# Patient Record
Sex: Female | Born: 1940 | Race: Black or African American | Hispanic: No | Smoking: Former smoker
Health system: Southern US, Community
[De-identification: ages and names within clinical notes are randomized; demographics above are authoritative.]

## PROBLEM LIST (undated history)

## (undated) DIAGNOSIS — I10 Essential (primary) hypertension: Secondary | ICD-10-CM

## (undated) DIAGNOSIS — I509 Heart failure, unspecified: Secondary | ICD-10-CM

## (undated) DIAGNOSIS — C50919 Malignant neoplasm of unspecified site of unspecified female breast: Secondary | ICD-10-CM

## (undated) DIAGNOSIS — E119 Type 2 diabetes mellitus without complications: Secondary | ICD-10-CM

## (undated) DIAGNOSIS — Z923 Personal history of irradiation: Secondary | ICD-10-CM

## (undated) DIAGNOSIS — Z9221 Personal history of antineoplastic chemotherapy: Secondary | ICD-10-CM

## (undated) DIAGNOSIS — J45909 Unspecified asthma, uncomplicated: Secondary | ICD-10-CM

## (undated) DIAGNOSIS — E7800 Pure hypercholesterolemia, unspecified: Secondary | ICD-10-CM

## (undated) HISTORY — PX: DILATION AND CURETTAGE OF UTERUS: SHX78

---

## 2001-03-15 DIAGNOSIS — Z923 Personal history of irradiation: Secondary | ICD-10-CM

## 2001-03-15 DIAGNOSIS — Z9221 Personal history of antineoplastic chemotherapy: Secondary | ICD-10-CM

## 2001-03-15 HISTORY — PX: BREAST LUMPECTOMY: SHX2

## 2001-03-15 HISTORY — PX: PORT-A-CATH REMOVAL: SHX5289

## 2001-03-15 HISTORY — DX: Personal history of irradiation: Z92.3

## 2001-03-15 HISTORY — PX: BREAST BIOPSY: SHX20

## 2001-03-15 HISTORY — DX: Personal history of antineoplastic chemotherapy: Z92.21

## 2003-12-16 ENCOUNTER — Ambulatory Visit: Payer: Self-pay | Admitting: Internal Medicine

## 2004-02-17 ENCOUNTER — Ambulatory Visit: Payer: Self-pay | Admitting: Oncology

## 2004-03-18 ENCOUNTER — Ambulatory Visit: Payer: Self-pay | Admitting: Oncology

## 2005-01-12 ENCOUNTER — Ambulatory Visit: Payer: Self-pay | Admitting: Oncology

## 2005-01-13 ENCOUNTER — Ambulatory Visit: Payer: Self-pay | Admitting: Oncology

## 2005-02-12 ENCOUNTER — Ambulatory Visit: Payer: Self-pay | Admitting: Oncology

## 2005-03-26 ENCOUNTER — Ambulatory Visit: Payer: Self-pay | Admitting: Oncology

## 2005-08-12 ENCOUNTER — Ambulatory Visit: Payer: Self-pay | Admitting: Oncology

## 2005-08-13 ENCOUNTER — Ambulatory Visit: Payer: Self-pay | Admitting: Oncology

## 2005-08-31 ENCOUNTER — Encounter: Payer: Self-pay | Admitting: Oncology

## 2005-09-12 ENCOUNTER — Encounter: Payer: Self-pay | Admitting: Oncology

## 2005-12-22 ENCOUNTER — Ambulatory Visit: Payer: Self-pay | Admitting: Oncology

## 2006-01-13 ENCOUNTER — Ambulatory Visit: Payer: Self-pay | Admitting: Oncology

## 2006-03-31 ENCOUNTER — Ambulatory Visit: Payer: Self-pay | Admitting: Oncology

## 2006-04-15 ENCOUNTER — Ambulatory Visit: Payer: Self-pay | Admitting: Oncology

## 2006-05-14 ENCOUNTER — Ambulatory Visit: Payer: Self-pay | Admitting: Oncology

## 2006-09-13 ENCOUNTER — Ambulatory Visit: Payer: Self-pay | Admitting: Oncology

## 2006-10-14 ENCOUNTER — Ambulatory Visit: Payer: Self-pay | Admitting: Oncology

## 2006-11-09 ENCOUNTER — Ambulatory Visit: Payer: Self-pay | Admitting: Oncology

## 2006-11-14 ENCOUNTER — Ambulatory Visit: Payer: Self-pay | Admitting: Oncology

## 2007-02-20 ENCOUNTER — Ambulatory Visit: Payer: Self-pay | Admitting: Oncology

## 2007-04-16 ENCOUNTER — Ambulatory Visit: Payer: Self-pay | Admitting: Oncology

## 2007-05-14 ENCOUNTER — Ambulatory Visit: Payer: Self-pay | Admitting: Oncology

## 2007-06-14 ENCOUNTER — Ambulatory Visit: Payer: Self-pay | Admitting: Oncology

## 2008-08-13 ENCOUNTER — Ambulatory Visit: Payer: Self-pay | Admitting: Oncology

## 2008-09-04 ENCOUNTER — Ambulatory Visit: Payer: Self-pay | Admitting: Internal Medicine

## 2008-09-04 ENCOUNTER — Ambulatory Visit: Payer: Self-pay | Admitting: Oncology

## 2008-09-12 ENCOUNTER — Ambulatory Visit: Payer: Self-pay | Admitting: Oncology

## 2009-04-15 ENCOUNTER — Ambulatory Visit: Payer: Self-pay | Admitting: Oncology

## 2009-05-07 ENCOUNTER — Ambulatory Visit: Payer: Self-pay | Admitting: Oncology

## 2009-05-13 ENCOUNTER — Ambulatory Visit: Payer: Self-pay | Admitting: Oncology

## 2009-09-17 ENCOUNTER — Ambulatory Visit: Payer: Self-pay | Admitting: Oncology

## 2010-10-08 ENCOUNTER — Ambulatory Visit: Payer: Self-pay | Admitting: Internal Medicine

## 2012-04-24 ENCOUNTER — Ambulatory Visit: Payer: Self-pay | Admitting: Internal Medicine

## 2013-01-23 ENCOUNTER — Ambulatory Visit: Payer: Self-pay | Admitting: Oncology

## 2013-02-12 ENCOUNTER — Ambulatory Visit: Payer: Self-pay | Admitting: Oncology

## 2013-03-15 ENCOUNTER — Ambulatory Visit: Payer: Self-pay | Admitting: Oncology

## 2013-04-24 ENCOUNTER — Ambulatory Visit: Payer: Self-pay | Admitting: Oncology

## 2013-06-12 ENCOUNTER — Inpatient Hospital Stay: Payer: Self-pay | Admitting: Internal Medicine

## 2013-06-12 LAB — CBC
HCT: 41.8 % (ref 35.0–47.0)
HGB: 13.5 g/dL (ref 12.0–16.0)
MCH: 28.1 pg (ref 26.0–34.0)
MCHC: 32.4 g/dL (ref 32.0–36.0)
MCV: 87 fL (ref 80–100)
PLATELETS: 221 10*3/uL (ref 150–440)
RBC: 4.82 10*6/uL (ref 3.80–5.20)
RDW: 15 % — ABNORMAL HIGH (ref 11.5–14.5)
WBC: 7.9 10*3/uL (ref 3.6–11.0)

## 2013-06-12 LAB — COMPREHENSIVE METABOLIC PANEL
ANION GAP: 4 — AB (ref 7–16)
AST: 23 U/L (ref 15–37)
Albumin: 3.6 g/dL (ref 3.4–5.0)
Alkaline Phosphatase: 61 U/L
BILIRUBIN TOTAL: 0.4 mg/dL (ref 0.2–1.0)
BUN: 13 mg/dL (ref 7–18)
CALCIUM: 9.1 mg/dL (ref 8.5–10.1)
Chloride: 107 mmol/L (ref 98–107)
Co2: 27 mmol/L (ref 21–32)
Creatinine: 0.74 mg/dL (ref 0.60–1.30)
EGFR (African American): >60
EGFR (Non-African Amer.): >60
Glucose: 184 mg/dL — ABNORMAL HIGH (ref 65–99)
Osmolality: 281 (ref 275–301)
POTASSIUM: 4 mmol/L (ref 3.5–5.1)
SGPT (ALT): 28 U/L (ref 12–78)
Sodium: 138 mmol/L (ref 136–145)
TOTAL PROTEIN: 7.3 g/dL (ref 6.4–8.2)

## 2013-06-12 LAB — URINALYSIS, COMPLETE
Bacteria: NONE SEEN
Glucose,UR: NEGATIVE mg/dL (ref 0–75)
KETONE: NEGATIVE
NITRITE: NEGATIVE
Ph: 7 (ref 4.5–8.0)
Protein: NEGATIVE
SPECIFIC GRAVITY: 1.01 (ref 1.003–1.030)
Squamous Epithelial: 1
WBC UR: 7 /HPF (ref 0–5)

## 2013-06-12 LAB — ETHANOL: Ethanol: <3 mg/dL

## 2013-06-12 LAB — CK TOTAL AND CKMB (NOT AT ARMC)
CK, Total: 120 U/L
CK, Total: 108 U/L
CK-MB: 2 ng/mL (ref 0.5–3.6)
CK-MB: 1.8 ng/mL (ref 0.5–3.6)

## 2013-06-12 LAB — TROPONIN I: Troponin-I: <0.02 ng/mL

## 2013-06-13 LAB — SEDIMENTATION RATE: Erythrocyte Sed Rate: 9 mm/hr (ref 0–30)

## 2013-06-13 LAB — TROPONIN I: Troponin-I: <0.02 ng/mL

## 2013-09-13 ENCOUNTER — Ambulatory Visit: Payer: Self-pay | Admitting: Oncology

## 2014-02-14 ENCOUNTER — Ambulatory Visit: Payer: Self-pay | Admitting: Oncology

## 2014-02-15 LAB — CANCER ANTIGEN 27.29: CA 27.29: 23.9 U/mL (ref 0.0–38.6)

## 2014-03-15 ENCOUNTER — Ambulatory Visit: Payer: Self-pay | Admitting: Oncology

## 2014-06-28 ENCOUNTER — Other Ambulatory Visit: Payer: Self-pay | Admitting: Oncology

## 2014-06-28 DIAGNOSIS — Z853 Personal history of malignant neoplasm of breast: Secondary | ICD-10-CM
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
## 2014-07-06 NOTE — Discharge Summary (Signed)
PATIENT NAMEDonetta, [name] MR#:  [id number] DATE OF BIRTH:  1940-03-19  DATE OF ADMISSION:  06/12/2013 DATE OF DISCHARGE:  06/14/2013   FINAL DIAGNOSES:  1. Left temporal cerebrovascular accident, acute.  2. Chronic left-sided systolic congestive heart failure.  3. Diabetes mellitus.  4. History of breast cancer.  5. Hypertension.   HISTORY AND PHYSICAL: Please see dictated admission history and physical.   HOSPITAL COURSE: The patient was admitted after episode of syncope. Evaluation revealed evidence of left temporal acute CVA. She was seen by neurology and by cardiology. Telemetry was followed, without significant dysrhythmias. Carotids were negative. She had been on aspirin 81 mg daily, and this was increased to 325 mg, and statin therapy was added, which she tolerated without issue. Her sugars remained reasonably controlled.    She underwent echocardiogram, which revealed an LVEF of 25 to 35%, a new finding, but without major symptoms. Again, no dysrhythmias were seen to link this to her episode of syncope. It is recommended that she follow up with cardiology to further investigate. She was started on Coreg during this hospitalization, which she tolerated.   At this time, she will be discharged home in stable condition. Physical therapy worked with her and did not feel she needed any further services. She should weigh herself daily, and call in for more than 2 pound gain in 1 day or 5 pounds in 1 week or increasing signs or symptoms of heart failure, which were discussed with the patient. She should check her blood sugar daily and record this. She will follow up in our office within the next 1 to 2 weeks and with neurology at first available and with Dr. Nehemiah Massed in the next 2 to 4 weeks.   DISCHARGE MEDICATIONS:  1. Lisinopril 10 mg p.o. daily.  2. Meloxicam 15 mg p.o. daily as needed for pain.  3. Multivitamin 1 p.o. daily. 4. Glimepiride 2 mg p.o. b.i.d.  5. Aspirin 325 mg p.o. daily.   6. Atorvastatin 40 mg p.o. daily.  7. Carvedilol 3.125 mg p.o. b.i.d.   ____________________________ Adin Hector, MD bjk:lb D: 06/14/2013 08:18:40 ET T: 06/14/2013 08:41:27 ET JOB#: [id number]  cc: Adin Hector, MD, <Dictator> Ramonita Lab MD ELECTRONICALLY SIGNED 06/18/2013 12:42

## 2014-07-06 NOTE — Consult Note (Signed)
PATIENT NAME:  [name], [name] MR#:  [id number] DATE OF BIRTH:  12-11-1940  DATE OF CONSULTATION:  06/13/2013  REFERRING PHYSICIAN:  Dr. Benjie Karvonen CONSULTING PHYSICIAN:  Corey Skains, MD  REASON FOR CONSULTATION: Acute syncopal episode with stroke, hypertension, hyperlipidemia, diabetes and abnormal echocardiogram.   CHIEF COMPLAINT: "I had passed out."  HISTORY OF PRESENT ILLNESS:  This is a 74 year old female who has done fairly well with diabetes, hypertension and hyperlipidemia treatments who has had recent headaches and concerns of headaches for which she has had syncopal episode with seizure. Further investigation after this issue, the patient has appeared to have had an MRI consistent with a stroke. The patient does feel better with no further headache and is on appropriate medication management.  Further investigation of her issues have seen an EKG showing sinus tachycardia with left axis deviation, poor precordial progression with pre-atrial contractions. In addition to that, the patient had an echocardiogram showing moderate global LV systolic dysfunction with ejection fraction of 35% and some mild to moderate valvular heart disease. She has had cancer chemotherapy suggestive of this causing her LV systolic dysfunction, but currently has no evidence of overt heart failure-type symptoms or true angina.  The remainder review of systems negative for vision change, ringing in the ears, hearing loss, cough, congestion, heartburn, nausea, vomiting, diarrhea, bloody stools, diaphoresis, frequent urination, urination at night, muscle weakness, numbness, anxiety, depression, skin lesions, skin rashes.   PAST MEDICAL HISTORY: 1.  Diabetes.  2.  Hypertension.  3.  Stroke.   FAMILY HISTORY: Multiple family members with early onset of cardiovascular disease and hypertension.   SOCIAL HISTORY: Currently denies alcohol or tobacco abuse.   ALLERGIES: As listed.   MEDICATIONS: As listed.   PHYSICAL  EXAMINATION: VITAL SIGNS: Blood pressure is 126/68, bilaterally, heart rate 72 upright, reclining and slightly irregular.  GENERAL: She is a well appearing female in no acute distress.  HEAD, EYES, EARS, NOSE AND THROAT:  No icterus, thyromegaly, ulcers, hemorrhage or xanthelasma.  CARDIOVASCULAR: Regular rate and rhythm. Normal S1 and S2 without murmur, gallop or rub. PMI is normal size and placement. Carotid upstroke normal without bruit. Jugular venous pressure is normal.  LUNGS: Clear to auscultation with normal respirations.  ABDOMEN: Soft, nontender, without hepatosplenomegaly or masses. Abdominal aorta is normal size without bruit.  EXTREMITIES: Shows 2+ bilateral pulses in dorsal, pedal, radial and femoral arteries without lower extremity edema, cyanosis, clubbing or ulcers.  NEUROLOGIC: She is oriented to time, place and person. Normal mood and affect.   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   RECOMMENDATIONS: 1.  Continue treatment of stroke with appropriate medication management. 2.  Consider statin therapy for further risk reduction in future cardiovascular disease.  3.  Further consideration of use of beta blocker and ACE inhibitor for cardiomyopathy for risk reduction in development of congestive heart failure.  4.  Diabetes medication management for hemoglobin A1c below 7.  5.  Ambulation and follow up for any further significant symptoms requiring further intervention and treatment options, including the possibility of future outpatient stress test if necessary for risk stratification of cardiomyopathy.  ____________________________ Corey Skains, MD bjk:ce D: 06/13/2013 16:45:54 ET T: 06/13/2013 17:50:50 ET JOB#: [id number]  cc: Corey Skains, MD,  <Dictator> Corey Skains MD ELECTRONICALLY SIGNED 06/18/2013 13:01

## 2015-02-19 ENCOUNTER — Inpatient Hospital Stay: Payer: Medicare Other | Attending: Oncology

## 2015-02-19 ENCOUNTER — Inpatient Hospital Stay: Payer: Medicare Other | Admitting: Oncology

## 2015-02-19 ENCOUNTER — Inpatient Hospital Stay: Payer: Medicare Other | Admitting: Family Medicine

## 2015-03-16 ENCOUNTER — Observation Stay
Admission: EM | Admit: 2015-03-16 | Discharge: 2015-03-18 | Disposition: A | Discharge status: Home / Self Care | Payer: Medicare Other | Attending: Internal Medicine | Admitting: Internal Medicine

## 2015-03-16 ENCOUNTER — Emergency Department: Payer: Medicare Other

## 2015-03-16 DIAGNOSIS — R112 Nausea with vomiting, unspecified: Secondary | ICD-10-CM | POA: Insufficient documentation

## 2015-03-16 DIAGNOSIS — R42 Dizziness and giddiness: Principal | ICD-10-CM | POA: Insufficient documentation

## 2015-03-16 DIAGNOSIS — E119 Type 2 diabetes mellitus without complications: Secondary | ICD-10-CM | POA: Insufficient documentation

## 2015-03-16 DIAGNOSIS — Z8249 Family history of ischemic heart disease and other diseases of the circulatory system: Secondary | ICD-10-CM | POA: Insufficient documentation

## 2015-03-16 DIAGNOSIS — Z79899 Other long term (current) drug therapy: Secondary | ICD-10-CM | POA: Diagnosis not present

## 2015-03-16 DIAGNOSIS — Z853 Personal history of malignant neoplasm of breast: Secondary | ICD-10-CM | POA: Diagnosis not present

## 2015-03-16 DIAGNOSIS — Z7951 Long term (current) use of inhaled steroids: Secondary | ICD-10-CM | POA: Diagnosis not present

## 2015-03-16 DIAGNOSIS — Z7982 Long term (current) use of aspirin: Secondary | ICD-10-CM | POA: Insufficient documentation

## 2015-03-16 DIAGNOSIS — Z791 Long term (current) use of non-steroidal anti-inflammatories (NSAID): Secondary | ICD-10-CM | POA: Insufficient documentation

## 2015-03-16 DIAGNOSIS — I10 Essential (primary) hypertension: Secondary | ICD-10-CM | POA: Diagnosis not present

## 2015-03-16 DIAGNOSIS — Z888 Allergy status to other drugs, medicaments and biological substances status: Secondary | ICD-10-CM | POA: Diagnosis not present

## 2015-03-16 HISTORY — DX: Malignant neoplasm of unspecified site of unspecified female breast: C50.919

## 2015-03-16 HISTORY — DX: Type 2 diabetes mellitus without complications: E11.9

## 2015-03-16 HISTORY — DX: Essential (primary) hypertension: I10

## 2015-03-16 LAB — BASIC METABOLIC PANEL
ANION GAP: 7 (ref 5–15)
BUN: 12 mg/dL (ref 6–20)
CHLORIDE: 104 mmol/L (ref 101–111)
CO2: 27 mmol/L (ref 22–32)
Calcium: 9.5 mg/dL (ref 8.9–10.3)
Creatinine, Ser: 0.65 mg/dL (ref 0.44–1.00)
GFR calc non Af Amer: >60 mL/min (ref >60)
Glucose, Bld: 264 mg/dL — ABNORMAL HIGH (ref 65–99)
Potassium: 4.2 mmol/L (ref 3.5–5.1)
Sodium: 138 mmol/L (ref 135–145)

## 2015-03-16 LAB — URINALYSIS COMPLETE WITH MICROSCOPIC (ARMC ONLY)
BILIRUBIN URINE: NEGATIVE
Bacteria, UA: NONE SEEN
GLUCOSE, UA: 50 mg/dL — AB
HGB URINE DIPSTICK: NEGATIVE
LEUKOCYTES UA: NEGATIVE
Nitrite: NEGATIVE
PH: 6 (ref 5.0–8.0)
Protein, ur: NEGATIVE mg/dL
Specific Gravity, Urine: 1.01 (ref 1.005–1.030)

## 2015-03-16 LAB — TROPONIN I: Troponin I: <0.03 ng/mL (ref <0.031)

## 2015-03-16 LAB — CBC
HCT: 38.6 % (ref 35.0–47.0)
Hemoglobin: 12.6 g/dL (ref 12.0–16.0)
MCH: 27.3 pg (ref 26.0–34.0)
MCHC: 32.6 g/dL (ref 32.0–36.0)
MCV: 83.9 fL (ref 80.0–100.0)
PLATELETS: 218 10*3/uL (ref 150–440)
RBC: 4.6 MIL/uL (ref 3.80–5.20)
RDW: 13.9 % (ref 11.5–14.5)
WBC: 8.4 10*3/uL (ref 3.6–11.0)

## 2015-03-16 LAB — GLUCOSE, CAPILLARY: Glucose-Capillary: 152 mg/dL — ABNORMAL HIGH (ref 65–99)

## 2015-03-16 MED ORDER — ALBUTEROL SULFATE (2.5 MG/3ML) 0.083% IN NEBU: 2.5000 mg | INHALATION_SOLUTION | Freq: Four times a day (QID) | RESPIRATORY_TRACT | Status: DC | PRN

## 2015-03-16 MED ORDER — MECLIZINE HCL 25 MG PO TABS
25.0000 mg | ORAL_TABLET | Freq: Once | ORAL | Status: AC
  Administered 2015-03-16: 25 mg via ORAL

## 2015-03-16 MED ORDER — SODIUM CHLORIDE 0.9 % IJ SOLN
3.0000 mL | Freq: Two times a day (BID) | INTRAMUSCULAR | Status: DC
  Administered 2015-03-16 – 2015-03-18 (×4): 3 mL via INTRAVENOUS

## 2015-03-16 MED ORDER — PROMETHAZINE HCL 25 MG/ML IJ SOLN
12.5000 mg | Freq: Once | INTRAMUSCULAR | Status: AC
  Administered 2015-03-16: 12.5 mg via INTRAVENOUS
  Filled 2015-03-16: qty 1

## 2015-03-16 MED ORDER — MECLIZINE HCL 25 MG PO TABS
25.0000 mg | ORAL_TABLET | Freq: Three times a day (TID) | ORAL | Status: DC
  Administered 2015-03-16 – 2015-03-18 (×5): 25 mg via ORAL
  Filled 2015-03-16 (×5): qty 1

## 2015-03-16 MED ORDER — PROMETHAZINE HCL 25 MG/ML IJ SOLN
6.2500 mg | Freq: Once | INTRAMUSCULAR | Status: AC
  Administered 2015-03-16: 6.25 mg via INTRAVENOUS

## 2015-03-16 MED ORDER — FLUTICASONE PROPIONATE 50 MCG/ACT NA SUSP
2.0000 | Freq: Every day | NASAL | Status: DC
  Administered 2015-03-17 – 2015-03-18 (×2): 2 via NASAL
  Filled 2015-03-16: qty 16

## 2015-03-16 MED ORDER — SODIUM CHLORIDE 0.9 % IV SOLN
250.0000 mL | INTRAVENOUS | Status: DC | PRN
Start: 2015-03-16 — End: 2015-03-18

## 2015-03-16 MED ORDER — CARVEDILOL 3.125 MG PO TABS
3.1250 mg | ORAL_TABLET | Freq: Two times a day (BID) | ORAL | Status: DC
  Administered 2015-03-16 – 2015-03-18 (×4): 3.125 mg via ORAL
  Filled 2015-03-16 (×4): qty 1

## 2015-03-16 MED ORDER — INSULIN ASPART 100 UNIT/ML ~~LOC~~ SOLN
0.0000 [IU] | Freq: Three times a day (TID) | SUBCUTANEOUS | Status: DC
  Administered 2015-03-17: 3 [IU] via SUBCUTANEOUS
  Administered 2015-03-17 – 2015-03-18 (×2): 1 [IU] via SUBCUTANEOUS
  Filled 2015-03-16: qty 3
  Filled 2015-03-16 (×3): qty 1

## 2015-03-16 MED ORDER — ACETAMINOPHEN 650 MG RE SUPP: 650.0000 mg | Freq: Four times a day (QID) | RECTAL | Status: DC | PRN

## 2015-03-16 MED ORDER — MECLIZINE HCL 25 MG PO TABS
ORAL_TABLET | ORAL | Status: AC
  Administered 2015-03-16: 25 mg via ORAL
  Filled 2015-03-16: qty 1

## 2015-03-16 MED ORDER — ONDANSETRON HCL 4 MG PO TABS: 4.0000 mg | ORAL_TABLET | Freq: Four times a day (QID) | ORAL | Status: DC | PRN

## 2015-03-16 MED ORDER — MECLIZINE HCL 25 MG PO TABS: 25.0000 mg | ORAL_TABLET | Freq: Three times a day (TID) | ORAL | Status: DC

## 2015-03-16 MED ORDER — ALBUTEROL SULFATE HFA 108 (90 BASE) MCG/ACT IN AERS: 2.0000 | INHALATION_SPRAY | Freq: Four times a day (QID) | RESPIRATORY_TRACT | Status: DC | PRN

## 2015-03-16 MED ORDER — LISINOPRIL 20 MG PO TABS
40.0000 mg | ORAL_TABLET | Freq: Every day | ORAL | Status: DC
  Administered 2015-03-17 – 2015-03-18 (×2): 40 mg via ORAL
  Filled 2015-03-16 (×2): qty 2

## 2015-03-16 MED ORDER — SODIUM CHLORIDE 0.9 % IJ SOLN: 3.0000 mL | INTRAMUSCULAR | Status: DC | PRN

## 2015-03-16 MED ORDER — MELOXICAM 7.5 MG PO TABS: 15.0000 mg | ORAL_TABLET | Freq: Every day | ORAL | Status: DC | PRN

## 2015-03-16 MED ORDER — ACETAMINOPHEN 325 MG PO TABS: 650.0000 mg | ORAL_TABLET | Freq: Four times a day (QID) | ORAL | Status: DC | PRN

## 2015-03-16 MED ORDER — LORAZEPAM 2 MG/ML IJ SOLN
1.0000 mg | Freq: Once | INTRAMUSCULAR | Status: AC
  Administered 2015-03-16: 1 mg via INTRAVENOUS
  Filled 2015-03-16: qty 1

## 2015-03-16 MED ORDER — ADULT MULTIVITAMIN W/MINERALS CH
1.0000 | ORAL_TABLET | Freq: Every day | ORAL | Status: DC
Start: 2015-03-16 — End: 2015-03-18
  Administered 2015-03-17 – 2015-03-18 (×2): 1 via ORAL
  Filled 2015-03-16 (×3): qty 1

## 2015-03-16 MED ORDER — SPIRONOLACTONE 25 MG PO TABS
25.0000 mg | ORAL_TABLET | ORAL | Status: DC
  Administered 2015-03-17 – 2015-03-18 (×2): 25 mg via ORAL
  Filled 2015-03-16 (×2): qty 1

## 2015-03-16 MED ORDER — PROMETHAZINE HCL 25 MG/ML IJ SOLN
INTRAMUSCULAR | Status: AC
  Administered 2015-03-16: 6.25 mg via INTRAVENOUS
  Filled 2015-03-16: qty 1

## 2015-03-16 MED ORDER — MECLIZINE HCL 25 MG PO TABS
25.0000 mg | ORAL_TABLET | Freq: Once | ORAL | Status: AC
  Administered 2015-03-16: 25 mg via ORAL
  Filled 2015-03-16: qty 1

## 2015-03-16 MED ORDER — ENOXAPARIN SODIUM 40 MG/0.4ML ~~LOC~~ SOLN
40.0000 mg | SUBCUTANEOUS | Status: DC
  Administered 2015-03-16 – 2015-03-17 (×2): 40 mg via SUBCUTANEOUS
  Filled 2015-03-16 (×2): qty 0.4

## 2015-03-16 MED ORDER — ASPIRIN EC 81 MG PO TBEC
81.0000 mg | DELAYED_RELEASE_TABLET | Freq: Every day | ORAL | Status: DC
  Administered 2015-03-17 – 2015-03-18 (×2): 81 mg via ORAL
  Filled 2015-03-16 (×2): qty 1

## 2015-03-16 MED ORDER — ONDANSETRON HCL 4 MG/2ML IJ SOLN
4.0000 mg | Freq: Four times a day (QID) | INTRAMUSCULAR | Status: DC | PRN
  Administered 2015-03-16: 4 mg via INTRAVENOUS
  Filled 2015-03-16: qty 2

## 2015-03-16 NOTE — ED Notes (Signed)
Phenergan completed and line flushed. Pt assisted to toilet with two person assist. Pt states that nausea has decreased but states still feels like room is spinning. Pt assisted back to bed and placed back on monitor.

## 2015-03-16 NOTE — ED Notes (Signed)
Pt back from CT and is complaining of continued nausea and vomiting. Medication administered per MD orders at this time. Will continue to monitor pt.

## 2015-03-16 NOTE — ED Notes (Signed)
Pt covered with warm blankets  Family at bedside

## 2015-03-16 NOTE — ED Notes (Signed)
Pt arrives to ED via [facility] c/o dizziness and nausea/vomiting that began at [place] today after sudden onset of epigastric pain that has now ceased. Pt received 4mg  Zofran IM en route with EMS. Pt denies pain at this time. Pt states she feels that the room is spinning. Spitting up thick white production. Diaphoretic.

## 2015-03-16 NOTE — ED Notes (Signed)
Patient transported to CT 

## 2015-03-16 NOTE — Plan of Care (Signed)
Problem: Nutrition: Goal: Adequate nutrition will be maintained Outcome: Progressing Patient is alert and oriented. No complaints of nausea. VSS. Medication given for nausea with good affect. Remains dizzy.

## 2015-03-16 NOTE — H&P (Signed)
[facility] [place] NAME: [name]    MR#:  [id number]  DATE OF BIRTH:  1940/08/31  DATE OF ADMISSION:  03/16/2015  PRIMARY CARE PHYSICIAN: Tama High III, MD   REQUESTING/REFERRING PHYSICIAN: dr Samella Parr   my room was spinning today HISTORY OF PRESENT ILLNESS:  [name]  is a 75 y.o. female with a known history of type 2 diabetes, hypertension, history of breast cancer comes to the emergency room after patient started having symptoms of dizziness and "" room spinning this morning. Patient had an episode of vomiting felt nauseous. She was difficult to walk around without having fever on falling. Denies any falls. Denies any focal weakness dysarthria. Denies history of stroke in the past. Patient received a couple doses of meclizine and an overdose of Ativan. Chest fields readily better how warm moment I got her to sit up she started feeling symptoms of dizziness. She is being admitted for what ago.    PAST MEDICAL HISTORY:   Past Medical History  Diagnosis Date  . Diabetes mellitus without complication ([place])   . Hypertension   . Breast cancer ([place])     PAST SURGICAL HISTOIRY:  History reviewed. No pertinent past surgical history.  SOCIAL HISTORY:   Social History  Substance Use Topics  . Smoking status: Never Smoker   . Smokeless tobacco: Not on file  . Alcohol Use: No    FAMILY HISTORY:  No family history on file.  DRUG ALLERGIES:   Allergies  Allergen Reactions  . Codeine Other (See Comments)    hallucinations  . Lovastatin     Other reaction(s): Unknown  . Metformin     Other reaction(s): Unknown    REVIEW OF SYSTEMS:  Review of Systems  Constitutional: Positive for diaphoresis. Negative for fever and chills.  HENT: Negative for congestion, ear pain, hearing loss, nosebleeds and sore throat.   Eyes: Negative for blurred vision, double vision, photophobia and pain.  Respiratory: Negative for  hemoptysis, sputum production, wheezing and stridor.   Cardiovascular: Negative for orthopnea, claudication and leg swelling.  Gastrointestinal: Negative for heartburn and abdominal pain.  Genitourinary: Negative for dysuria and frequency.  Musculoskeletal: Negative for back pain, joint pain and neck pain.  Skin: Negative for rash.  Neurological: Positive for dizziness and weakness. Negative for tingling, sensory change, speech change, focal weakness, seizures and headaches.  Endo/Heme/Allergies: Does not bruise/bleed easily.  Psychiatric/Behavioral: Negative for suicidal ideas, memory loss and substance abuse. The patient is not nervous/anxious.   All other systems reviewed and are negative.    MEDICATIONS AT HOME:   Prior to Admission medications   Medication Sig Start Date End Date Taking? Authorizing Provider  albuterol (PROAIR HFA) 108 (90 Base) MCG/ACT inhaler Inhale 2 puffs into the lungs 4 (four) times daily as needed. For wheezing. 08/05/14  Yes Historical Provider, MD  aspirin EC 81 MG tablet Take 81 mg by mouth daily.   Yes Historical Provider, MD  carvedilol (COREG) 3.125 MG tablet Take 3.125 mg by mouth 2 (two) times daily. 11/28/14  Yes Historical Provider, MD  fluticasone (FLONASE) 50 MCG/ACT nasal spray Place 2 sprays into the nose daily. 10/25/14  Yes Historical Provider, MD  lisinopril (PRINIVIL,ZESTRIL) 40 MG tablet Take 40 mg by mouth daily. 02/24/15  Yes Historical Provider, MD  meloxicam (MOBIC) 15 MG tablet Take 15 mg by mouth daily as needed. For pain. 06/24/14  Yes Historical Provider, MD  Multiple Vitamin (MULTI-VITAMINS) TABS  Take 1 tablet by mouth daily.   Yes Historical Provider, MD  spironolactone (ALDACTONE) 25 MG tablet Take 25 mg by mouth every morning. 01/24/15  Yes Historical Provider, MD      VITAL SIGNS:  Blood pressure 105/65, pulse 94, temperature 95.8 F (35.4 C), temperature source Rectal, resp. rate 31, height 5\' 5"  (1.651 m), weight 79.379 kg (175  lb), SpO2 96 %.  PHYSICAL EXAMINATION:  GENERAL:  75 y.o.-year-old patient lying in the bed with no acute distress.  EYES: Pupils equal, round, reactive to light and accommodation. No scleral icterus. Extraocular muscles intact.  HEENT: Head atraumatic, normocephalic. Oropharynx and nasopharynx clear.  NECK:  Supple, no jugular venous distention. No thyroid enlargement, no tenderness.  LUNGS: Normal breath sounds bilaterally, no wheezing, rales,rhonchi or crepitation. No use of accessory muscles of respiration.  CARDIOVASCULAR: S1, S2 normal. No murmurs, rubs, or gallops.  ABDOMEN: Soft, nontender, nondistended. Bowel sounds present. No organomegaly or mass.  EXTREMITIES: No pedal edema, cyanosis, or clubbing.  NEUROLOGIC: Cranial nerves II through XII are intact. Muscle strength 5/5 in all extremities. Sensation intact. Gait not checked.  PSYCHIATRIC: The patient is alert and oriented x 3.  SKIN: No obvious rash, lesion, or ulcer.   LABORATORY PANEL:   CBC  Recent Labs Lab 03/16/15 1034  WBC 8.4  HGB 12.6  HCT 38.6  PLT 218   ------------------------------------------------------------------------------------------------------------------  Chemistries   Recent Labs Lab 03/16/15 1034  NA 138  K 4.2  CL 104  CO2 27  GLUCOSE 264*  BUN 12  CREATININE 0.65  CALCIUM 9.5   ------------------------------------------------------------------------------------------------------------------  Cardiac Enzymes  Recent Labs Lab 03/16/15 1034  TROPONINI <0.03   ------------------------------------------------------------------------------------------------------------------  RADIOLOGY:  Ct Head Wo Contrast  03/16/2015  CLINICAL DATA:  75 year old female with acute vertigo and nausea/ vomiting. EXAM: CT HEAD WITHOUT CONTRAST TECHNIQUE: Contiguous axial images were obtained from the base of the skull through the vertex without intravenous contrast. COMPARISON:  06/12/2013 CT.  FINDINGS: No intracranial abnormalities are identified, including mass lesion or mass effect, hydrocephalus, extra-axial fluid collection, midline shift, hemorrhage, or acute infarction. The visualized bony calvarium is unremarkable. The visualized mastoid air cells, middle/ inner ears and paranasal sinuses are clear. IMPRESSION: Unremarkable noncontrast head CT. Electronically Signed   By: Margarette Canada M.D.   On: 03/16/2015 12:02    EKG:   Sinus rhythm IMPRESSION AND PLAN:  ris Lantagne  is a 76 y.o. female with a known history of type 2 diabetes, hypertension, history of breast cancer comes to the emergency room after patient started having symptoms of dizziness and "" room spinning this morning. Patient had an episode of vomiting felt nauseous.   1. acute vertigo, appears benign positional given patient's presentation. -She has symptoms of sinus congestion a few days ago. -No history of stroke or any of the symptoms in the past. -Continue meclizine 25 mg 3 times a day, physical therapy. -CT of the head is negative. Consider MRI of the brain if symptoms are not improved.  -Consider outpatient ENT follow-up as well    2. TYPE 2 DIABETES CONTINUE HOME MEDS AND SLIDING SCALE INSULIN.   3. Hypertension  -Continue lisinopril carvedilol and spironolactone  4. DVT prophylaxis subcutaneous Lovenox   All the records are reviewed and case discussed with ED provider. Management plans discussed with the patient, family and they are in agreement.  CODE STATUS: full   TOTAL TIME TAKING CARE OF THIS PATIENT:45nutes.    Shomari Matusik M.D on 03/16/2015 at 3:22  PM  Between 7am to 6pm - Pager - [phone]  After 6pm go to [email or web address] - password [email or web address] [facility]  [facility]  Office  [phone]  CC: Primary care physician; Adin Hector, MD

## 2015-03-16 NOTE — ED Provider Notes (Signed)
Time Seen: Approximately *1034 I have reviewed the triage notes  Chief Complaint: Dizziness and Nausea   History of Present Illness: [name] is a 75 y.o. female who presents with acute onset of feeling that the room is spinning. Patient states she knows that this morning approximately 8 AM. She states she had been up and ambulatory prior to that for a short period of time. She states it feels like the room is spinning without any feelings of lightheadedness. She denies any tinnitus or facial weakness. She denies any focal weakness in either upper or lower extremities. She denies any facial pain patient's somewhat poor historian and slow to respond at times to questioning.   Past Medical History  Diagnosis Date  . Diabetes mellitus without complication ([place])   . Hypertension   . Breast cancer [facility])     Patient Active Problem List   Diagnosis Date Noted  . Vertigo 03/16/2015    History reviewed. No pertinent past surgical history.  History reviewed. No pertinent past surgical history.  Current Outpatient Rx  Name  Route  Sig  Dispense  Refill  . albuterol (PROAIR HFA) 108 (90 Base) MCG/ACT inhaler   Inhalation   Inhale 2 puffs into the lungs 4 (four) times daily as needed. For wheezing.         Marland Kitchen aspirin EC 81 MG tablet   Oral   Take 81 mg by mouth daily.         . carvedilol (COREG) 3.125 MG tablet   Oral   Take 3.125 mg by mouth 2 (two) times daily.         . fluticasone (FLONASE) 50 MCG/ACT nasal spray   Nasal   Place 2 sprays into the nose daily.         Marland Kitchen lisinopril (PRINIVIL,ZESTRIL) 40 MG tablet   Oral   Take 40 mg by mouth daily.         . meloxicam (MOBIC) 15 MG tablet   Oral   Take 15 mg by mouth daily as needed. For pain.         . Multiple Vitamin (MULTI-VITAMINS) TABS   Oral   Take 1 tablet by mouth daily.         Marland Kitchen spironolactone (ALDACTONE) 25 MG tablet   Oral   Take 25 mg by mouth every morning.           Allergies:   Codeine; Lovastatin; and Metformin  Family History: No family history on file.  Social History: Social History  Substance Use Topics  . Smoking status: Never Smoker   . Smokeless tobacco: None  . Alcohol Use: No     Review of Systems:   10 point review of systems was performed and was otherwise negative:  Constitutional: No fever Eyes: No visual disturbances ENT: No sore throat, ear pain Cardiac: No chest pain Respiratory: No shortness of breath, wheezing, or stridor Abdomen: No abdominal pain, no vomiting, No diarrhea Endocrine: No weight loss, No night sweats Extremities: No peripheral edema, cyanosis Skin: No rashes, easy bruising Neurologic: No focal weakness, trouble with speech or swollowing Urologic: No dysuria, Hematuria, or urinary frequency   Physical Exam:  ED Triage Vitals  Enc Vitals Group     BP 03/16/15 1025 140/83 mmHg     Pulse Rate 03/16/15 1025 89     Resp 03/16/15 1025 12     Temp 03/16/15 1040 95.8 F (35.4 C)     Temp Source 03/16/15 1055  Rectal     SpO2 03/16/15 1025 98 %     Weight 03/16/15 1025 175 lb (79.379 kg)     Height 03/16/15 1025 5\' 5"  (1.651 m)     Head Cir --      Peak Flow --      Pain Score --      Pain Loc --      Pain Edu? --      Excl. in [place]? --     General: Awake , Alert , and Oriented times 3; GCS 15 Head: Normal cephalic , atraumatic. Conjunctiva are clear. Patient has trouble keeping her eyes open for exam though appears to have some 4-5 beat left lateral nystagmus which is exhaustive Eyes: Pupils equal , round, reactive to light Nose/Throat: No nasal drainage, patent upper airway without erythema or exudate.  Neck: Supple, Full range of motion, No anterior adenopathy or palpable thyroid masses no bruits Lungs: Clear to ascultation without wheezes , rhonchi, or rales Heart: Regular rate, regular rhythm without murmurs , gallops , or rubs Abdomen: Soft, non tender without rebound, guarding , or rigidity; bowel  sounds positive and symmetric in all 4 quadrants. No organomegaly .        Extremities: 2 plus symmetric pulses. No edema, clubbing or cyanosis Neurologic: , Motor symmetric without deficits, sensory intact. No cerebellar signs Skin: warm, dry, no rashes   Labs:   All laboratory work was reviewed including any pertinent negatives or positives listed below:  Labs Reviewed  BASIC METABOLIC PANEL - Abnormal; Notable for the following:    Glucose, Bld 264 (*)    All other components within normal limits  URINALYSIS COMPLETEWITH MICROSCOPIC ([facility] ONLY) - Abnormal; Notable for the following:    Color, Urine YELLOW (*)    APPearance CLEAR (*)    Glucose, UA 50 (*)    Ketones, ur TRACE (*)    Squamous Epithelial / LPF 0-5 (*)    All other components within normal limits  CBC  TROPONIN I  CBG MONITORING, ED    EKG: ED ECG REPORT I, Daymon Larsen, the attending physician, personally viewed and interpreted this ECG.  Date: 03/16/2015 EKG Time: 1021 Rate: 86 Rhythm: normal sinus rhythm occasional PACs QRS Axis is left axis deviation Intervals: normal ST/T Wave abnormalities: normal Conduction Disutrbances: none Narrative Interpretation: unremarkable No acute ischemic changes  vomiting.  EXAM: CT HEAD WITHOUT CONTRAST  TECHNIQUE: Contiguous axial images were obtained from the base of the skull through the vertex without intravenous contrast.  COMPARISON: 06/12/2013 CT.  FINDINGS: No intracranial abnormalities are identified, including mass lesion or mass effect, hydrocephalus, extra-axial fluid collection, midline shift, hemorrhage, or acute infarction.  The visualized bony calvarium is unremarkable.  The visualized mastoid air cells, middle/ inner ears and paranasal sinuses are clear.  IMPRESSION: Unremarkable noncontrast head CT.  Radiology:      I personally reviewed the radiologic studies   P ED Course:  Patient's stay here showed little  improvement. She had some persistent nausea and vomiting associated with her vertigo. She was initially given Zofran with really no effect. She was given low-dose Phenergan at 6.25 again with no effect and was given a repeat dosage of Phenergan. Her nausea and vomiting seems to improved and she was given oral meclizine 25 mg 2. She still has a hard time moving her head nor attempting ambulation at this time and I felt uncomfortable discharging the patient. I did briefly attempt Epley maneuvers with the patient  have any significant reproduction of her symptoms but no resolution. I felt she most likely had peripheral exhaustive vertigo at this time and is unlikely to be central vertigo. Patient does not exhibit any other focal neurologic deficits.    Assessment:  Persistent peripheral vertigo   Final Clinical Impression: *  Final diagnoses:  Vertigo     Plan: Inpatient management            Daymon Larsen, MD 03/16/15 [phone]

## 2015-03-16 NOTE — ED Notes (Signed)
Pt denies nausea and pain at this time. Pt complaining of room still spinning. Pt's HOB raised. Family returned to room and MD aware. Waiting on results.

## 2015-03-16 NOTE — ED Notes (Signed)
MD at bedside. 

## 2015-03-17 ENCOUNTER — Observation Stay: Payer: Medicare Other

## 2015-03-17 LAB — GLUCOSE, CAPILLARY
GLUCOSE-CAPILLARY: 142 mg/dL — AB (ref 65–99)
GLUCOSE-CAPILLARY: 204 mg/dL — AB (ref 65–99)
GLUCOSE-CAPILLARY: 120 mg/dL — AB (ref 65–99)
Glucose-Capillary: 121 mg/dL — ABNORMAL HIGH (ref 65–99)

## 2015-03-17 MED ORDER — MECLIZINE HCL 25 MG PO TABS: 25.0000 mg | ORAL_TABLET | Freq: Three times a day (TID) | ORAL | Status: AC

## 2015-03-17 NOTE — Plan of Care (Signed)
Problem: Pain Managment: Goal: General experience of comfort will improve Outcome: Progressing No c/o pain  Problem: Physical Regulation: Goal: Ability to maintain clinical measurements within normal limits will improve Outcome: Progressing Pt noted to have decreasing vertigo throughout shift. Understood the need to take Antivert to help maintain the vertigo.  Problem: Activity: Goal: Risk for activity intolerance will decrease Outcome: Progressing Pt continues to need stand by assist when ambulating due to occasional wobbling

## 2015-03-17 NOTE — Care Management Obs Status (Signed)
[place] NOTIFICATION   Patient Details  Name: Leyton Strain MRN: [id number] Date of Birth: 09-03-1940   Medicare Observation Status Notification Given:  Yes    Shelbie Ammons, RN 03/17/2015, 8:55 AM

## 2015-03-17 NOTE — Plan of Care (Signed)
Problem: Safety: Goal: Ability to remain free from injury will improve Outcome: Progressing Pt states she is having decrease in her dizziness.  Does need stand by assist to ambulate secondary to wobbling  Occasionally.

## 2015-03-17 NOTE — Discharge Summary (Signed)
[name], is a 75 y.o. female  DOB [birth year]  MRN [id number].  Admission date:  03/16/2015  Admitting Physician  Fritzi Mandes, MD  Discharge Date:  03/17/2015   Primary MD  Will[place] III, MD  Recommendations for primary care physician for things to follow:   F/u Dr.Bert KLEIN in one week.  She is  requesting stress test for cardiac evaluation which can be done as an outpatient. She does not have any chest pain, shortness of breath. Admission Diagnosis  Vertigo [R42]   Discharge Diagnosis  Vertigo [R42]    Active Problems:   Vertigo      Past Medical History  Diagnosis Date  . Diabetes mellitus without complication ([place])   . Hypertension   . Breast cancer ([place])     History reviewed. No pertinent past surgical history.     History of present illness and  Hospital Course:     Kindly see H&P for history of present illness and admission details, please review complete Labs, Consult reports and Test reports for all details in brief  HPI  from the history and physical done on the day of admission 75 year old female patient with history of diabetes mellitus type 2, hypertension admitted secondary to dizziness, positional vertigo. Patient felt sweaty, nauseous and dizzy suddenly started yesterday.   Hospital Course   #1 dizziness likely due to positional vertigo: Symptoms nicely improved with meclizine. However still has some occasional dizziness. Concern about possibility of posterior circulation strokes on going to get MRI of the brain. If the MRI of the brain is negative patient can be discharged home physical therapy, saw the patient and recommended outpatient physical therapy for vestibular exercises. #2 diabetes mellitus type 2 without complications: Continue home medications. #Hypertension; controlled  continue home medications. #4 history of coronary artery disease in the family multiple members of the family had heart disease. Patient concerned about heart attack. This time she has no chest pain, shortness of breath. I advised her to follow with primary doctor and possibly scheduling stress test as an outpatient.    Discharge Condition: stable,   Follow UP      Discharge Instructions  and  Discharge Medications        Medication List    TAKE these medications        aspirin EC 81 MG tablet  Take 81 mg by mouth daily.     carvedilol 3.125 MG tablet  Commonly known as:  COREG  Take 3.125 mg by mouth 2 (two) times daily.     fluticasone 50 MCG/ACT nasal spray  Commonly known as:  FLONASE  Place 2 sprays into the nose daily.     lisinopril 40 MG tablet  Commonly known as:  PRINIVIL,ZESTRIL  Take 40 mg by mouth daily.     meclizine 25 MG tablet  Commonly known as:  ANTIVERT  Take 1 tablet (25 mg total) by mouth 3 (three) times daily.     meloxicam 15 MG tablet  Commonly known as:  MOBIC  Take 15 mg by mouth daily as needed. For pain.     MULTI-VITAMINS Tabs  Take 1 tablet by mouth daily.     PROAIR HFA 108 (90 Base) MCG/ACT inhaler  Generic drug:  albuterol  Inhale 2 puffs into the lungs 4 (four) times daily as needed. For wheezing.     spironolactone 25 MG tablet  Commonly known as:  ALDACTONE  Take 25 mg by mouth every morning.  Diet and Activity recommendation: See Discharge Instructions above   Consults obtained - PT   Major procedures and Radiology Reports - PLEASE review detailed and final reports for all details, in brief -     Ct Head Wo Contrast  03/16/2015  CLINICAL DATA:  75 year old female with acute vertigo and nausea/ vomiting. EXAM: CT HEAD WITHOUT CONTRAST TECHNIQUE: Contiguous axial images were obtained from the base of the skull through the vertex without intravenous contrast. COMPARISON:  06/12/2013 CT. FINDINGS: No  intracranial abnormalities are identified, including mass lesion or mass effect, hydrocephalus, extra-axial fluid collection, midline shift, hemorrhage, or acute infarction. The visualized bony calvarium is unremarkable. The visualized mastoid air cells, middle/ inner ears and paranasal sinuses are clear. IMPRESSION: Unremarkable noncontrast head CT. Electronically Signed   By: Margarette Canada M.D.   On: 03/16/2015 12:02    Micro Results     No results found for this or any previous visit (from the past 240 hour(s)).     Today   Subjective:   [name] today has no headache,no chest abdominal pain,no new weakness tingling or numbness, feels much better wants to go home today.   Objective:   Blood pressure 136/60, pulse 91, temperature 98.3 F (36.8 C), temperature source Oral, resp. rate 18, height 5\' 4"  (1.626 m), weight 81.92 kg (180 lb 9.6 oz), SpO2 95 %.   Intake/Output Summary (Last 24 hours) at 03/17/15 1158 Last data filed at 03/17/15 0800  Gross per 24 hour  Intake    240 ml  Output      0 ml  Net    240 ml    Exam Awake Alert, Oriented x 3, No new F.N deficits, Normal affect [place].AT,PERRAL Supple Neck,No JVD, No cervical lymphadenopathy appriciated.  Symmetrical Chest wall movement, Good air movement bilaterally, CTAB RRR,No Gallops,Rubs or new Murmurs, No Parasternal Heave +ve B.Sounds, Abd Soft, Non tender, No organomegaly appriciated, No rebound -guarding or rigidity. No Cyanosis, Clubbing or edema, No new Rash or bruise  Data Review   CBC w Diff: Lab Results  Component Value Date   WBC 8.4 03/16/2015   WBC 7.9 06/12/2013   HGB 12.6 03/16/2015   HGB 13.5 06/12/2013   HCT 38.6 03/16/2015   HCT 41.8 06/12/2013   PLT 218 03/16/2015   PLT 221 06/12/2013    CMP: Lab Results  Component Value Date   NA 138 03/16/2015   NA 138 06/12/2013   K 4.2 03/16/2015   K 4.0 06/12/2013   CL 104 03/16/2015   CL 107 06/12/2013   CO2 27 03/16/2015   CO2 27 06/12/2013    BUN 12 03/16/2015   BUN 13 06/12/2013   CREATININE 0.65 03/16/2015   CREATININE 0.74 06/12/2013   PROT 7.3 06/12/2013   ALBUMIN 3.6 06/12/2013   BILITOT 0.4 06/12/2013   ALKPHOS 61 06/12/2013   AST 23 06/12/2013   ALT 28 06/12/2013  .   Total Time in preparing paper work, data evaluation and todays exam - 68 minutes  Shaquile Lutze M.D on 03/17/2015 at 11:58 AM    Note: This dictation was prepared with Dragon dictation along with smaller phrase technology. Any transcriptional errors that result from this process are unintentional.

## 2015-03-17 NOTE — Evaluation (Signed)
Physical Therapy Evaluation Patient Details Name: [name] MRN: [id number] DOB: [birth year] Today's Date: 03/17/2015   History of Present Illness  Pt is a 75 y.o. female presenting to hospital with dizziness and room spinning; also episode of vomiting d/t feeling nauseas.  Head CT negative.  Pt admitted with acute vertigo.  PMH includes DM type 2, htn, breast CA.  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  Pt lives with her 2 grandchildren in 1 level home with 2 steps to enter no railing.  Currently pt is modified independent supine to sit (HOB elevated); CGA to stand no AD (min assist to control descent sitting on 2nd trial standing after gait attempt d/t increasing dizziness with time standing and then pt reporting feeling really nauseas and had emesis); and CGA to max assist for balance with short ambulation distance in room no AD (pt with increasing dizziness with time standing and with fall towards bed requiring mod to max assist to prevent fall).  Pt initially appearing steady with first attempt standing and first few steps but dizziness significantly got worse next few steps.  No nystagmus noted during functional mobility but some noted looking with eyes superior and to L in general.  Pt would benefit from skilled PT to address noted impairments and functional limitations.  Limited session d/t pt feeling very nauseas and with emesis (nursing notified).  Pt currently very limited with mobility and requiring significant assist for balance d/t dizziness.  Anticipate once vertigo improves/resolves, pt will be able to discharge back home.     Follow Up Recommendations Outpatient PT (vestibular pending further work-up)    Equipment Recommendations   (TBD)    Recommendations for Other Services       Precautions / Restrictions Precautions Precautions: Fall Restrictions Weight Bearing Restrictions: No      Mobility  Bed  Mobility Overal bed mobility: Modified Independent             General bed mobility comments: Supine to sit with HOB elevated  Transfers Overall transfer level: Needs assistance Equipment used: None Transfers: Sit to/from Stand Sit to Stand: Min guard;Min assist         General transfer comment: Initial stand CGA but with time standing pt becoming dizzy and requiring assist to control descent back into sitting  Ambulation/Gait Ambulation/Gait assistance: Min guard;Min assist;Mod assist;Max assist (varying levels d/t dizziness) Ambulation Distance (Feet): 15 Feet Assistive device: None   Gait velocity: decreased   General Gait Details: pt steady first few steps but then started to hold onto bed d/t feeling dizzy; pt then c/o significant dizziness requiring min assist to steady and pt requiring mod to max assist to prevent fall onto bed (then with min to mod assist able to get pt safely to recliner chair)  Stairs            Wheelchair Mobility    Modified Rankin (Stroke Patients Only)       Balance Overall balance assessment: Needs assistance Sitting-balance support: No upper extremity supported;Feet supported Sitting balance-Leahy Scale: Normal     Standing balance support: No upper extremity supported Standing balance-Leahy Scale: Poor Standing balance comment: d/t dizziness pt with multiple loss of balance                             Pertinent Vitals/Pain Pain Assessment: No/denies pain  Vitals stable and WFL throughout  treatment session.    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  Pt agreeable to PT session.    Exercises        Assessment/Plan    PT Assessment Patient needs continued PT services  PT Diagnosis Difficulty walking   PT Problem List Decreased balance;Decreased mobility  PT Treatment Interventions DME instruction;Gait training;Stair training;Functional mobility training;Therapeutic activities;Therapeutic exercise;Balance training;Patient/family education   PT Goals (Current goals can be found in the Care Plan section) Acute Rehab PT Goals Patient Stated Goal: to go home PT Goal Formulation: With patient Time For Goal Achievement: 03/31/15 Potential to Achieve Goals: Good    Frequency Min 2X/week   Barriers to discharge   Dizziness    Co-evaluation               End of Session Equipment Utilized During Treatment: Gait belt Activity Tolerance:  (Pt limited d/t dizziness and nauseas/emesis) Patient left: in chair;with call bell/phone within reach;with chair alarm set Nurse Communication: Mobility status;Precautions (Emesis)    Functional Assessment Tool Used: AM-PAC without stairs Functional Limitation: Mobility: Walking and moving around Mobility: Walking and Moving Around Current Status [phone]): At least 40 percent but less than 60 percent impaired, limited or restricted Mobility: Walking and Moving Around Goal Status [phone]): 0 percent impaired, limited or  restricted    Time: [id number] PT Time Calculation (min) (ACUTE ONLY): 28 min   Charges:   PT Evaluation $Initial PT Evaluation Tier I: 1 Procedure     PT G Codes:   PT G-Codes **NOT FOR INPATIENT CLASS** Functional Assessment Tool Used: AM-PAC without stairs Functional Limitation: Mobility: Walking and moving around Mobility: Walking and Moving Around Current Status [id number]): At least 40 percent but less than 60 percent impaired, limited or restricted Mobility: Walking and Moving Around Goal Status [phone]): 0 percent impaired, limited or restricted    Leitha Bleak 03/17/2015, 10:45 AM Leitha Bleak, PT [phone]

## 2015-03-17 NOTE — Plan of Care (Signed)
Problem: Safety: Goal: Ability to remain free from injury will improve Outcome: Progressing No injury this shift.   

## 2015-03-17 NOTE — Care Management (Signed)
Admitted to [facility] with the diagnosis of Vertigo under observation status. Lives with grandson/granddaughter. Sister is Letta Median [phone]). Brother is Coralyn Mark [phone]). No home health. No skilled facility. No home oxygen. Uses no aids for ambulation. 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 Takes care of both basic and instrumental activities of daily living herself, drives. No falls. Good appetite until today. Family will transport. Shelbie Ammons RN MSN CCM Care Management [phone]

## 2015-03-18 LAB — GLUCOSE, CAPILLARY
GLUCOSE-CAPILLARY: 152 mg/dL — AB (ref 65–99)
Glucose-Capillary: 128 mg/dL — ABNORMAL HIGH (ref 65–99)

## 2015-03-18 NOTE — Progress Notes (Signed)
     [name] was admitted to the Hospital on 03/16/2015 and Discharged  03/18/2015 and should be excused from work/school   for 6  days starting 03/16/2015 , may return to work/school without any restrictions.stay off work till Jan 6th.    Epifanio Lesches M.D on 03/18/2015,at 10:03 AM

## 2015-03-18 NOTE — Progress Notes (Signed)
[facility] NAME: Milania Hamric    MR#:  [id number]  DATE OF BIRTH:  15-Mar-1941  SUBJECTIVE: Needed for dizziness, positional vertigo. Started on meclizine. She has some dizziness. So MRI of the brain is ordered. Discharge is held because MRI of the brain is not done yet.   CHIEF COMPLAINT:   Chief Complaint  Patient presents with  . Dizziness  . Nausea    REVIEW OF SYSTEMS:   ROS CONSTITUTIONAL: No fever, fatigue or weakness.  EYES: No blurred or double vision.  EARS, NOSE, AND THROAT: No tinnitus or ear pain.  RESPIRATORY: No cough, shortness of breath, wheezing or hemoptysis.  CARDIOVASCULAR: No chest pain, orthopnea, edema.  GASTROINTESTINAL: No nausea, vomiting, diarrhea or abdominal pain.  GENITOURINARY: No dysuria, hematuria.  ENDOCRINE: No polyuria, nocturia,  HEMATOLOGY: No anemia, easy bruising or bleeding SKIN: No rash or lesion. MUSCULOSKELETAL: No joint pain or arthritis.   NEUROLOGIC: No tingling, numbness, weakness.  PSYCHIATRY: No anxiety or depression.   DRUG ALLERGIES:   Allergies  Allergen Reactions  . Codeine Other (See Comments)    hallucinations  . Lovastatin     Other reaction(s): Unknown  . Metformin     Other reaction(s): Unknown    VITALS:  Blood pressure 109/50, pulse 80, temperature 98 F (36.7 C), temperature source Oral, resp. rate 20, height 5\' 4"  (1.626 m), weight 81.92 kg (180 lb 9.6 oz), SpO2 94 %.  PHYSICAL EXAMINATION:  GENERAL:  75 y.o.-year-old patient lying in the bed with no acute distress.  EYES: Pupils equal, round, reactive to light and accommodation. No scleral icterus. Extraocular muscles intact.  HEENT: Head atraumatic, normocephalic. Oropharynx and nasopharynx clear.  NECK:  Supple, no jugular venous distention. No thyroid enlargement, no tenderness.  LUNGS: Normal breath sounds bilaterally, no wheezing, rales,rhonchi or crepitation. No use of accessory muscles  of respiration.  CARDIOVASCULAR: S1, S2 normal. No murmurs, rubs, or gallops.  ABDOMEN: Soft, nontender, nondistended. Bowel sounds present. No organomegaly or mass.  EXTREMITIES: No pedal edema, cyanosis, or clubbing.  NEUROLOGIC: Cranial nerves II through XII are intact. Muscle strength 5/5 in all extremities. Sensation intact. Gait not checked.  PSYCHIATRIC: The patient is alert and oriented x 3.  SKIN: No obvious rash, lesion, or ulcer.    LABORATORY PANEL:   CBC  Recent Labs Lab 03/16/15 1034  WBC 8.4  HGB 12.6  HCT 38.6  PLT 218   ------------------------------------------------------------------------------------------------------------------  Chemistries   Recent Labs Lab 03/16/15 1034  NA 138  K 4.2  CL 104  CO2 27  GLUCOSE 264*  BUN 12  CREATININE 0.65  CALCIUM 9.5   ------------------------------------------------------------------------------------------------------------------  Cardiac Enzymes  Recent Labs Lab 03/16/15 1034  TROPONINI <0.03   ------------------------------------------------------------------------------------------------------------------  RADIOLOGY:  Mr Brain Wo Contrast  03/17/2015  CLINICAL DATA:  75 year old hypertensive diabetic female with lung cancer. Positional vertigo. Partial response to the medication. Subsequent encounter. EXAM: MRI HEAD WITHOUT CONTRAST TECHNIQUE: Multiplanar, multiecho pulse sequences of the brain and surrounding structures were obtained without intravenous contrast. COMPARISON:  03/16/2015 CT.  06/13/2013 MR. FINDINGS: No acute infarct. Minimal chronic small vessel disease type changes. No intracranial hemorrhage. No intracranial mass lesion noted on this unenhanced exam. No hydrocephalus. Major intracranial vascular structures are patent. Cervical medullary junction, pituitary region, pineal region and orbital structures unremarkable. Minimal ethmoid sinus mucosal thickening. Mild maxillary sinus mucosal  thickening. IMPRESSION: No acute infarct. Minimal ethmoid sinus mucosal thickening. Mild maxillary sinus mucosal thickening.  Electronically Signed   By: Genia Del M.D.   On: 03/17/2015 17:36    EKG:   Orders placed or performed during the hospital encounter of 03/16/15  . ED EKG  . ED EKG  . EKG 12-Lead  . EKG 12-Lead    ASSESSMENT AND PLAN:  1 dizziness likely due to BPV: Patient symptoms improved slightly with meclizine. Scheduled for MRI of the brain if it's negative for posterior circulation stroke patient will be discharged home on meclizine.     All the records are reviewed and case discussed with Care Management/Social Workerr. Management plans discussed with the patient, family and they are in agreement.  CODE STATUS:full  TOTAL TIME TAKING CARE OF THIS PATIENT: 35 minutes.   POSSIBLE D/C IN 1DAYS, DEPENDING ON CLINICAL CONDITION.   Epifanio Lesches M.D on 03/18/2015 at 12:11 PM  Between 7am to 6pm - Pager - [phone]  After 6pm go to [email or web address] - password [email or web address] [facility]  Office  [phone]  CC: Primary care physician; Adin Hector, MD   Note: This dictation was prepared with Dragon dictation along with smaller phrase technology. Any transcriptional errors that result from this process are unintentional.

## 2015-03-18 NOTE — Plan of Care (Signed)
Problem: Safety: Goal: Ability to remain free from injury will improve Outcome: Progressing On high fall precautions per policy. Ambulates with assist.  Problem: Pain Managment: Goal: General experience of comfort will improve Outcome: Progressing No complaints of pain.

## 2015-03-18 NOTE — Progress Notes (Signed)
FSBS 128 pet NT Judeen Hammans, reports she had to do patient override

## 2015-03-18 NOTE — Discharge Summary (Signed)
[name], is a 75 y.o. female  DOB [name] 28, 1942  MRN [id number].  Admission date:  03/16/2015  Admitting Physician  Fritzi Mandes, MD  Discharge Date:  03/18/2015   Primary MD  [place] III, MD  Recommendations for primary care physician for things to follow:   F/u Dr.Bert KLEIN in one week.  She is  requesting stress test for cardiac evaluation which can be done as an outpatient. She does not have any chest pain, shortness of breath. Admission Diagnosis  Vertigo [R42]   Discharge Diagnosis  Vertigo [R42]    Active Problems:   Vertigo      Past Medical History  Diagnosis Date  . Diabetes mellitus without complication ([place])   . Hypertension   . Breast cancer ([place])     History reviewed. No pertinent past surgical history.     History of present illness and  Hospital Course:     Kindly see H&P for history of present illness and admission details, please review complete Labs, Consult reports and Test reports for all details in brief  HPI  from the history and physical done on the day of admission 75 year old female patient with history of diabetes mellitus type 2, hypertension admitted secondary to dizziness, positional vertigo. Patient felt sweaty, nauseous and dizzy suddenly started yesterday.   Hospital Course   #1 dizziness likely due to positional vertigo: Symptoms nicely improved with meclizine. However still has some occasional dizziness. Concern about possibility of posterior circulation strokes on going to get MRI of the brain. If the MRI of the brain is negative patient can be discharged home physical therapy, saw the patient and recommended outpatient physical therapy for vestibular exercises. #2 diabetes mellitus type 2 without complications: Continue home medications. #Hypertension; controlled  continue home medications. #4 history of coronary artery disease in the family multiple members of the family had heart disease. Patient concerned about heart attack. This time she has no chest pain, shortness of breath. I advised her to follow with primary doctor and possibly scheduling stress test as an outpatient.  Addendum on  Jan 3rd;: Patient MRI of the brain is negative.  she feels much better today. Stable to go home today.  Discharge Condition: stable,   Follow UP      Discharge Instructions  and  Discharge Medications         Discharge Instructions    Ambulatory referral to Physical Therapy    Complete by:  As directed   Vestibular exercises  Iontophoresis - 4 mg/ml of dexamethasone:  No  T.E.N.S. Unit Evaluation and Dispense as Indicated:  No            Medication List    TAKE these medications        aspirin EC 81 MG tablet  Take 81 mg by mouth daily.     carvedilol 3.125 MG tablet  Commonly known as:  COREG  Take 3.125 mg by mouth 2 (two) times daily.     fluticasone 50 MCG/ACT nasal spray  Commonly known as:  FLONASE  Place 2 sprays into the nose daily.     lisinopril 40 MG tablet  Commonly known as:  PRINIVIL,ZESTRIL  Take 40 mg by mouth daily.     meclizine 25 MG tablet  Commonly known as:  ANTIVERT  Take 1 tablet (25 mg total) by mouth 3 (three) times daily.     meloxicam 15 MG tablet  Commonly known as:  MOBIC  Take 15 mg  by mouth daily as needed. For pain.     MULTI-VITAMINS Tabs  Take 1 tablet by mouth daily.     PROAIR HFA 108 (90 Base) MCG/ACT inhaler  Generic drug:  albuterol  Inhale 2 puffs into the lungs 4 (four) times daily as needed. For wheezing.     spironolactone 25 MG tablet  Commonly known as:  ALDACTONE  Take 25 mg by mouth every morning.          Diet and Activity recommendation: See Discharge Instructions above   Consults obtained - PT   Major procedures and Radiology Reports - PLEASE review detailed and  final reports for all details, in brief -     Ct Head Wo Contrast  03/16/2015  CLINICAL DATA:  75 year old female with acute vertigo and nausea/ vomiting. EXAM: CT HEAD WITHOUT CONTRAST TECHNIQUE: Contiguous axial images were obtained from the base of the skull through the vertex without intravenous contrast. COMPARISON:  06/12/2013 CT. FINDINGS: No intracranial abnormalities are identified, including mass lesion or mass effect, hydrocephalus, extra-axial fluid collection, midline shift, hemorrhage, or acute infarction. The visualized bony calvarium is unremarkable. The visualized mastoid air cells, middle/ inner ears and paranasal sinuses are clear. IMPRESSION: Unremarkable noncontrast head CT. Electronically Signed   By: Margarette Canada M.D.   On: 03/16/2015 12:02   Mr Brain Wo Contrast  03/17/2015  CLINICAL DATA:  75 year old hypertensive diabetic female with lung cancer. Positional vertigo. Partial response to the medication. Subsequent encounter. EXAM: MRI HEAD WITHOUT CONTRAST TECHNIQUE: Multiplanar, multiecho pulse sequences of the brain and surrounding structures were obtained without intravenous contrast. COMPARISON:  03/16/2015 CT.  06/13/2013 MR. FINDINGS: No acute infarct. Minimal chronic small vessel disease type changes. No intracranial hemorrhage. No intracranial mass lesion noted on this unenhanced exam. No hydrocephalus. Major intracranial vascular structures are patent. Cervical medullary junction, pituitary region, pineal region and orbital structures unremarkable. Minimal ethmoid sinus mucosal thickening. Mild maxillary sinus mucosal thickening. IMPRESSION: No acute infarct. Minimal ethmoid sinus mucosal thickening. Mild maxillary sinus mucosal thickening. Electronically Signed   By: Genia Del M.D.   On: 03/17/2015 17:36    Micro Results     No results found for this or any previous visit (from the past 240 hour(s)).     Today   Subjective:   [name] today has no  headache,no chest abdominal pain,no new weakness tingling or numbness, feels much better wants to go home today.   Objective:   Blood pressure 109/50, pulse 80, temperature 98 F (36.7 C), temperature source Oral, resp. rate 20, height 5\' 4"  (1.626 m), weight 81.92 kg (180 lb 9.6 oz), SpO2 94 %.   Intake/Output Summary (Last 24 hours) at 03/18/15 1210 Last data filed at 03/18/15 0732  Gross per 24 hour  Intake    480 ml  Output   2050 ml  Net  -1570 ml    Exam Awake Alert, Oriented x 3, No new F.N deficits, Normal affect [place].AT,PERRAL Supple Neck,No JVD, No cervical lymphadenopathy appriciated.  Symmetrical Chest wall movement, Good air movement bilaterally, CTAB RRR,No Gallops,Rubs or new Murmurs, No Parasternal Heave +ve B.Sounds, Abd Soft, Non tender, No organomegaly appriciated, No rebound -guarding or rigidity. No Cyanosis, Clubbing or edema, No new Rash or bruise  Data Review   CBC w Diff:  Lab Results  Component Value Date   WBC 8.4 03/16/2015   WBC 7.9 06/12/2013   HGB 12.6 03/16/2015   HGB 13.5 06/12/2013   HCT 38.6 03/16/2015  HCT 41.8 06/12/2013   PLT 218 03/16/2015   PLT 221 06/12/2013    CMP:  Lab Results  Component Value Date   NA 138 03/16/2015   NA 138 06/12/2013   K 4.2 03/16/2015   K 4.0 06/12/2013   CL 104 03/16/2015   CL 107 06/12/2013   CO2 27 03/16/2015   CO2 27 06/12/2013   BUN 12 03/16/2015   BUN 13 06/12/2013   CREATININE 0.65 03/16/2015   CREATININE 0.74 06/12/2013   PROT 7.3 06/12/2013   ALBUMIN 3.6 06/12/2013   BILITOT 0.4 06/12/2013   ALKPHOS 61 06/12/2013   AST 23 06/12/2013   ALT 28 06/12/2013  .   Total Time in preparing paper work, data evaluation and todays exam - 42 minutes  Kenyanna Grzesiak M.D on 03/18/2015 at 12:10 PM    Note: This dictation was prepared with Dragon dictation along with smaller phrase technology. Any transcriptional errors that result from this process are unintentional.

## 2015-03-18 NOTE — Progress Notes (Addendum)
Notified Dr. Vianne Bulls that d/c summary report to d/c with outpatient home health but pt is d/c to home. Per MD discharge order is correct and that pt's PCP will have to order outpatient physical therapy, RN explained process to patient and she understands.   Patient is concerned that she doesn't have all the answers on vertigo or the cause, reviewed handout with patient about vertigo, explained to patient that vertigo can be cased by variety of etiologies, reviewed possibilities and educated patient on meclizine and positional changes and that Dr. Caryl Comes will help follow up further as outpatient.

## 2015-03-18 NOTE — Progress Notes (Signed)
Pt is alert and oriented x 4, easily irritable, on room air, vital signs stable, up to bathroom with stand by assist, good appetite, FSBS elevated at lunch but pt prefers to hold off on insulin as she will be leaving before trays are distributed, pt is concerned that she does not completely understand the cause and plan for vertigo. Printed information provided to patient, RN reviewed printed information and reported to patient that plan is for out patient physical therapy that will have to be ordered by PCP, Dr. Caryl Comes. Pt instructed to f/u with PCP in 1 week and to continue taking meclizine as prescribed. Pt reports understanding of d/c instructions and has no further questions at this time. Pt reports that MD said she would order a congestion medication, Dr. Michel Santee aware of patients request via telephone, per MD she will send something into pharmacy. Pt d/c via family, uneventful shift.   Pt does not want bed alarm since she is being d/c to home, pt educated on hospital policy and irritable over bed alarm, pt notified that bed alarm can be removed at discretion of patient and that it will be documented.

## 2016-02-10 ENCOUNTER — Inpatient Hospital Stay: Admission: RE | Admit: 2016-02-10 | Payer: Medicare Other | Source: Ambulatory Visit

## 2016-02-13 NOTE — H&P (Signed)
[name] is a 75 y.o. female here for Fractional d+c and myosure resection of an endometrial polyp  Consult from [facility] , [place] for PMB . Started several weeks ago . Heavy . ++ cramping .  Pt has had Breast ca 2003 . Was on Femara and has been off meds for several yrs . No pelvic pain otherwise . No fhx of uterine cancer. A large cervical polyp was removed at her consultation  EMBX = neg  Cx Polyp+ benign  SIS 01/08/16 : a large 2.04 x1.7 x2.33 cm  endometrial mass  Past Medical History:  has a past medical history of Allergic state; Asthma without status asthmaticus; Breast cancer (CMS-HCC); Cardiomyopathy, secondary (CMS-HCC); CHF (congestive heart failure) (CMS-HCC); Diabetes mellitus type 2, uncomplicated (CMS-HCC) ([id number]); Hyperlipidemia; Hypertension; Leukopenia; and Syncope and collapse (06/2013).  Past Surgical History:  has a past surgical history that includes lymphectomy; Breast surgery (Right, 2003); and Tubal ligation. Family History: family history includes Coronary artery disease in her brother, brother, and father; Heart disease in her brother; Lung cancer in her brother; No Known Problems in her mother. Social History:  reports that she has quit smoking. She quit smokeless tobacco use about 30 years ago. She reports that she does not drink alcohol. OB/GYN History:          OB History    Gravida Para Term Preterm AB Living   3 2 2   1 2    SAB TAB Ectopic Molar Multiple Live Births                    Allergies: is allergic to codeine; lovastatin; and metformin. Medications:  Current Outpatient Prescriptions:  .  aspirin 325 MG EC tablet, Take 325 mg by mouth once daily., Disp: , Rfl:  .  azelastine (ASTELIN) 137 mcg nasal spray, Place 1 spray into both nostrils 2 (two) times daily., Disp: 30 mL, Rfl: 3 .  carvedilol (COREG) 3.125 MG tablet, TAKE 1 TABLET BY MOUTH TWICE A DAY, Disp: 180 tablet, Rfl: 1 .  clopidogrel (PLAVIX) 75 mg tablet, TAKE 1 TABLET DAILY, Disp:  90 tablet, Rfl: 2 .  fluticasone (FLONASE) 50 mcg/actuation nasal spray, USE 2 SPRAYS EACH NOSTRILS EVERY DAY, Disp: 16 g, Rfl: 1 .  glimepiride (AMARYL) 4 MG tablet, TAKE 1/2 TABLET DAILY, Disp: 45 tablet, Rfl: 2 .  lisinopril (PRINIVIL,ZESTRIL) 40 MG tablet, TAKE 1 TABLET BY MOUTH ONCE A DAY, Disp: 90 tablet, Rfl: 3 .  meclizine (ANTIVERT) 25 mg tablet, Take by mouth., Disp: , Rfl:  .  meloxicam (MOBIC) 15 MG tablet, TAKE 1 TABLET BY MOUTH ONCE A DAY AS NEEDED, Disp: 30 tablet, Rfl: 5 .  multivitamin tablet, Take 1 tablet by mouth once daily., Disp: , Rfl:  .  PROAIR HFA 90 mcg/actuation inhaler, INHALE 2 PUFFS BY MOUTH 4 TIMES A DAY AS NEEDED WHEEZE, Disp: 8.5 Inhaler, Rfl: 6 .  spironolactone (ALDACTONE) 25 MG tablet, Take 1 tablet (25 mg total) by mouth once daily., Disp: 90 tablet, Rfl: 2  Review of Systems: General:                      No fatigue or weight loss Eyes:                           No vision changes Ears:  No hearing difficulty Respiratory:                No cough or shortness of breath Pulmonary:                  No asthma or shortness of breath Cardiovascular:           No chest pain, palpitations, dyspnea on exertion Gastrointestinal:          No abdominal bloating, chronic diarrhea, constipations, masses, pain or hematochezia Genitourinary:             No hematuria, dysuria, abnormal vaginal discharge, pelvic pain, Menometrorrhagia, + PMB  Lymphatic:                   No swollen lymph nodes Musculoskeletal:         No muscle weakness Neurologic:                  No extremity weakness, syncope, seizure disorder Psychiatric:                  No history of depression, delusions or suicidal/homicidal ideation    Exam:      Vitals:   01/08/16 1339  BP: 132/84  Pulse: 92    Body mass index is 30.42 kg/m.  WDWN black female in NAD   Lungs: CTA  CV : RRR without murmur    Neck:  no thyromegaly Abdomen: soft , no mass, normal  active bowel sounds,  non-tender, no rebound tenderness Pelvic: tanner stage 5 ,  External genitalia: vulva /labia no lesions Urethra: no prolapse Vagina: normal physiologic d/c Cervix: no lesions, no cervical motion tenderness   Uterus: normal size shape and contour, non-tender Adnexa: no mass,  non-tender   Rectovaginal:  Impression:   The primary encounter diagnosis was PMB (postmenopausal bleeding). A diagnosis of Endometrial mass was also pertinent to this visit.    Plan:   Recommend Myosure resection of the endometrial mass and Fractional d+c Risk of the procedure discussed with the pt. All questions answered .     Caroline Sauger, MD

## 2016-02-16 ENCOUNTER — Inpatient Hospital Stay: Admission: RE | Admit: 2016-02-16 | Payer: Medicare Other | Source: Ambulatory Visit

## 2016-02-20 ENCOUNTER — Ambulatory Visit
Admission: RE | Admit: 2016-02-20 | Payer: Medicare Other | Source: Ambulatory Visit | Admitting: Obstetrics and Gynecology

## 2016-02-20 ENCOUNTER — Encounter: Admission: RE | Payer: Self-pay | Source: Ambulatory Visit

## 2016-02-20 SURGERY — DILATATION & CURETTAGE/HYSTEROSCOPY WITH MYOSURE
Anesthesia: Choice

## 2016-03-10 ENCOUNTER — Other Ambulatory Visit: Payer: Self-pay | Admitting: Internal Medicine

## 2016-03-10 DIAGNOSIS — Z1231 Encounter for screening mammogram for malignant neoplasm of breast: Secondary | ICD-10-CM

## 2016-04-12 ENCOUNTER — Ambulatory Visit
Admission: RE | Admit: 2016-04-12 | Discharge: 2016-04-12 | Disposition: A | Discharge status: Home / Self Care | Payer: Medicare Other | Source: Ambulatory Visit | Attending: Internal Medicine | Admitting: Internal Medicine

## 2016-04-12 DIAGNOSIS — Z1231 Encounter for screening mammogram for malignant neoplasm of breast: Secondary | ICD-10-CM | POA: Insufficient documentation

## 2016-07-14 NOTE — H&P (Signed)
Allison Nearing, MD  Obstetrics    [] Hide copied text Allison.Hesteris a 76 y.o.femalehere for Fractional d+c and myosure resection of an endometrial polyp  Consult from [employer] , [place] for PMB . Started several weeks ago . Heavy . ++ cramping .  Pt has had Breast ca 2003 . Was on Femara and has been off meds for several yrs . No pelvic pain otherwise . No fhx of uterine cancer. A large cervical polyp was removed at her consultation  EMBX = neg  Cx Polyp+ benign  SIS 01/08/16 : a large 2.04 x1.7 x2.33 cm endometrial mass  Past Medical History:has a past medical history of Allergic state; Asthma without status asthmaticus; Breast cancer (CMS-HCC); Cardiomyopathy, secondary (CMS-HCC); CHF (congestive heart failure) (CMS-HCC); Diabetes mellitus type 2, uncomplicated (CMS-HCC) (2703); Hyperlipidemia; Hypertension; Leukopenia; and Syncope and collapse (06/2013). Past Surgical History:has a past surgical history that includes lymphectomy; Breast surgery (Right, 2003); and Tubal ligation. Family History:family history includes Coronary artery disease in her brother, brother, and father; Heart disease in her brother; Lung cancer in her brother; No Known Problems in her mother. Social History:reports that she has quit smoking. She quit smokeless tobacco use about 30 years ago. She reports that she does not drink alcohol. OB/GYN History:         OB History   Gravida Para Term Preterm AB Living   3 2 2  1 2    SAB TAB Ectopic Molar Multiple Live Births              Allergies:is allergic to codeine; lovastatin; and metformin. Medications:  Current Outpatient Prescriptions:  .aspirin 325 MG EC tablet, Take 325 mg by mouth once daily., Disp: , Rfl:  .azelastine (ASTELIN) 137 mcg nasal spray, Place 1 spray into both nostrils 2 (two) times daily., Disp: 30 mL, Rfl: 3 .carvedilol (COREG) 3.125 MG tablet, TAKE 1 TABLET BY MOUTH TWICE A DAY, Disp: 180 tablet,  Rfl: 1 .clopidogrel (PLAVIX) 75 mg tablet, TAKE 1 TABLET DAILY, Disp: 90 tablet, Rfl: 2 .fluticasone (FLONASE) 50 mcg/actuation nasal spray, USE 2 SPRAYS EACH NOSTRILS EVERY DAY, Disp: 16 g, Rfl: 1 .glimepiride (AMARYL) 4 MG tablet, TAKE 1/2 TABLET DAILY, Disp: 45 tablet, Rfl: 2 .lisinopril (PRINIVIL,ZESTRIL) 40 MG tablet, TAKE 1 TABLET BY MOUTH ONCE A DAY, Disp: 90 tablet, Rfl: 3 .meclizine (ANTIVERT) 25 mg tablet, Take by mouth., Disp: , Rfl:  .meloxicam (MOBIC) 15 MG tablet, TAKE 1 TABLET BY MOUTH ONCE A DAY AS NEEDED, Disp: 30 tablet, Rfl: 5 .multivitamin tablet, Take 1 tablet by mouth once daily., Disp: , Rfl:  .PROAIR HFA 90 mcg/actuation inhaler, INHALE 2 PUFFS BY MOUTH 4 TIMES A DAY AS NEEDED WHEEZE, Disp: 8.5 Inhaler, Rfl: 6 .spironolactone (ALDACTONE) 25 MG tablet, Take 1 tablet (25 mg total) by mouth once daily., Disp: 90 tablet, Rfl: 2  Review of Systems: General: No fatigue or weightloss Eyes:No vision changes Ears:No hearing difficulty Respiratory:No cough or shortness of breath Pulmonary: No asthma or shortness of breath Cardiovascular:No chest pain, palpitations, dyspnea on exertion Gastrointestinal:No abdominal bloating, chronic diarrhea, constipations, masses, pain or hematochezia Genitourinary:No hematuria, dysuria, abnormal vaginal discharge, pelvic pain, Menometrorrhagia, + PMB  Lymphatic:No swollen lymph nodes Musculoskeletal:No muscle weakness Neurologic:No extremity weakness, syncope, seizure disorder Psychiatric:No history of depression, delusions or suicidal/homicidal ideation   Exam:      Vitals:   01/08/16 1339  BP: 132/84  Pulse: 92    Body mass index is 30.42 kg/m.  WDWN black female in NAD  Lungs:  CTA  CV: RRR  without murmur   Neck: no thyromegaly Abdomen: soft , no mass, normal active bowel sounds, non-tender, no rebound tenderness Pelvic: tanner stage 5 ,  External genitalia: vulva /labia no lesions Urethra: no prolapse Vagina: normal physiologic d/c Cervix: no lesions, no cervical motion tenderness  Uterus: normal size shape and contour, non-tender Adnexa:no mass, non-tender  Rectovaginal:  Impression:   The primary encounter diagnosis was PMB (postmenopausal bleeding). A diagnosis of Endometrial mass was also pertinent to this visit.    Plan:   Recommend Myosure resection of the endometrial mass and Fractional d+c Risk of the procedure discussed with the pt. All questions answered .     Allison Sauger, MD

## 2016-08-10 ENCOUNTER — Encounter
Admission: RE | Admit: 2016-08-10 | Discharge: 2016-08-10 | Disposition: A | Discharge status: Home / Self Care | Payer: Medicare Other | Source: Ambulatory Visit | Attending: Obstetrics and Gynecology | Admitting: Obstetrics and Gynecology

## 2016-08-10 DIAGNOSIS — Z7984 Long term (current) use of oral hypoglycemic drugs: Secondary | ICD-10-CM | POA: Diagnosis not present

## 2016-08-10 DIAGNOSIS — Z7951 Long term (current) use of inhaled steroids: Secondary | ICD-10-CM | POA: Diagnosis not present

## 2016-08-10 DIAGNOSIS — I509 Heart failure, unspecified: Secondary | ICD-10-CM | POA: Diagnosis not present

## 2016-08-10 DIAGNOSIS — Z801 Family history of malignant neoplasm of trachea, bronchus and lung: Secondary | ICD-10-CM | POA: Diagnosis not present

## 2016-08-10 DIAGNOSIS — Z791 Long term (current) use of non-steroidal anti-inflammatories (NSAID): Secondary | ICD-10-CM | POA: Diagnosis not present

## 2016-08-10 DIAGNOSIS — E119 Type 2 diabetes mellitus without complications: Secondary | ICD-10-CM | POA: Diagnosis not present

## 2016-08-10 DIAGNOSIS — Z885 Allergy status to narcotic agent status: Secondary | ICD-10-CM | POA: Diagnosis not present

## 2016-08-10 DIAGNOSIS — N840 Polyp of corpus uteri: Secondary | ICD-10-CM | POA: Diagnosis not present

## 2016-08-10 DIAGNOSIS — Z888 Allergy status to other drugs, medicaments and biological substances status: Secondary | ICD-10-CM | POA: Diagnosis not present

## 2016-08-10 DIAGNOSIS — I429 Cardiomyopathy, unspecified: Secondary | ICD-10-CM | POA: Diagnosis not present

## 2016-08-10 DIAGNOSIS — Z7982 Long term (current) use of aspirin: Secondary | ICD-10-CM | POA: Diagnosis not present

## 2016-08-10 DIAGNOSIS — Z7901 Long term (current) use of anticoagulants: Secondary | ICD-10-CM | POA: Diagnosis not present

## 2016-08-10 DIAGNOSIS — Z8249 Family history of ischemic heart disease and other diseases of the circulatory system: Secondary | ICD-10-CM | POA: Diagnosis not present

## 2016-08-10 DIAGNOSIS — I110 Hypertensive heart disease with heart failure: Secondary | ICD-10-CM | POA: Diagnosis not present

## 2016-08-10 DIAGNOSIS — E785 Hyperlipidemia, unspecified: Secondary | ICD-10-CM | POA: Diagnosis not present

## 2016-08-10 DIAGNOSIS — Z87891 Personal history of nicotine dependence: Secondary | ICD-10-CM | POA: Diagnosis not present

## 2016-08-10 DIAGNOSIS — Z9851 Tubal ligation status: Secondary | ICD-10-CM | POA: Diagnosis not present

## 2016-08-10 DIAGNOSIS — Z9889 Other specified postprocedural states: Secondary | ICD-10-CM | POA: Diagnosis not present

## 2016-08-10 DIAGNOSIS — J45909 Unspecified asthma, uncomplicated: Secondary | ICD-10-CM | POA: Diagnosis not present

## 2016-08-10 DIAGNOSIS — Z79899 Other long term (current) drug therapy: Secondary | ICD-10-CM | POA: Diagnosis not present

## 2016-08-10 DIAGNOSIS — N950 Postmenopausal bleeding: Secondary | ICD-10-CM | POA: Diagnosis not present

## 2016-08-10 DIAGNOSIS — Z853 Personal history of malignant neoplasm of breast: Secondary | ICD-10-CM | POA: Diagnosis not present

## 2016-08-10 HISTORY — DX: Unspecified asthma, uncomplicated: J45.909

## 2016-08-10 HISTORY — DX: Pure hypercholesterolemia, unspecified: E78.00

## 2016-08-10 HISTORY — DX: Heart failure, unspecified: I50.9

## 2016-08-10 HISTORY — DX: Personal history of antineoplastic chemotherapy: Z92.21

## 2016-08-10 LAB — BASIC METABOLIC PANEL
ANION GAP: 9 (ref 5–15)
BUN: 13 mg/dL (ref 6–20)
CO2: 27 mmol/L (ref 22–32)
Calcium: 9.8 mg/dL (ref 8.9–10.3)
Chloride: 106 mmol/L (ref 101–111)
Creatinine, Ser: 0.55 mg/dL (ref 0.44–1.00)
GLUCOSE: 101 mg/dL — AB (ref 65–99)
POTASSIUM: 3.5 mmol/L (ref 3.5–5.1)
Sodium: 142 mmol/L (ref 135–145)

## 2016-08-10 LAB — CBC
HCT: 39.4 % (ref 35.0–47.0)
Hemoglobin: 13 g/dL (ref 12.0–16.0)
MCH: 28 pg (ref 26.0–34.0)
MCHC: 32.9 g/dL (ref 32.0–36.0)
MCV: 85.2 fL (ref 80.0–100.0)
Platelets: 231 10*3/uL (ref 150–440)
RBC: 4.62 MIL/uL (ref 3.80–5.20)
RDW: 14.4 % (ref 11.5–14.5)
WBC: 9.9 10*3/uL (ref 3.6–11.0)

## 2016-08-10 LAB — TYPE AND SCREEN
ABO/RH(D): O POS
ANTIBODY SCREEN: NEGATIVE

## 2016-08-10 NOTE — Patient Instructions (Signed)
Your procedure is scheduled on: August 13, 2016 (Friday) Report to Same Day Surgery 2nd floor medical mall [facility] Entrance-take elevator on left to 2nd floor.  Check in with surgery information desk.) To find out your arrival time please call [phone] between 1PM - 3PM on Aug 12, 2016 (Thursday)   Remember: Instructions that are not followed completely may result in serious medical risk, up to and including death, or upon the discretion of your surgeon and anesthesiologist your surgery may need to be rescheduled.    _x___ 1. Do not eat food or drink liquids after midnight. No gum chewing or hard candies                          .     __x__ 2. No Alcohol for 24 hours before or after surgery.   __x__3. No Smoking for 24 prior to surgery.   ____  4. Bring all medications with you on the day of surgery if instructed.    __x__ 5. Notify your doctor if there is any change in your medical condition     (cold, fever, infections).     Do not wear jewelry, make-up, hairpins, clips or nail polish.  Do not wear lotions, powders, or perfumes. You may wear deodorant.  Do not shave 48 hours prior to surgery. Men may shave face and neck.  Do not bring valuables to the hospital.    [facility] is not responsible for any belongings or valuables.               Contacts, dentures or bridgework may not be worn into surgery.  Leave your suitcase in the car. After surgery it may be brought to your room.  For patients admitted to the hospital, discharge time is determined by your treatment team                       Patients discharged the day of surgery will not be allowed to drive home.  You will need someone to drive you home and stay with you the night of your procedure.    Please read over the following fact sheets that you were given:   [facility] Preparing for Surgery and or MRSA Information   _x___ Take the following medications the morning of surgery with a sip of water :    1.  Carvedilol 2. Lisinopril     ____Fleets enema or Magnesium Citrate as directed.   ____ Use CHG Soap or sage wipes as directed on instruction sheet   _x___ Use inhalers on the day of surgery and bring to hospital day of surgery (USE ALBUTEROL INHALER THE MORNING OF SURGERY AND BRING TO [place] )  ____ Stop Metformin and Janumet 2 days prior to surgery.    ____ Take 1/2 of usual insulin dose the night before surgery and none on the morning     surgery.   _x___ Follow recommendations from Cardiologist, Pulmonologist or PCP regarding          stopping Aspirin, Coumadin, Plavix ,Eliquis, Effient, or Pradaxa, and Pletal. (PATIENT STATED SHE HAS STOPPED ASPIRIN AND PLAVIX  )  X____Stop Anti-inflammatories such as Advil, Aleve, Ibuprofen, Motrin, Naproxen, Naprosyn, Goodies powders or aspirin products. OK to take Tylenol  (STOP MELOXICAM NOW UNTIL AFTER SURGERY)  _x___ Stop supplements until after surgery.  But may continue Vitamin D, Vitamin B, and multivitamin      ____ Bring C-Pap to the hospital.

## 2016-08-10 NOTE — Pre-Procedure Instructions (Signed)
Component Name Value Ref Range  Vent Rate (bpm) 92   PR Interval (msec) 190   QRS Interval (msec) 88   QT Interval (msec) 370   QTc (msec) 457   Result Narrative  Normal sinus rhythm Minimal voltage criteria for LVH, may be normal variant Septal infarct , age undetermined Abnormal ECG No previous ECGs available I reviewed and concur with this report. Electronically signed RP:[email or web address] MD, Darnell Level [phone]) on 03/29/2016 1:22:22 PM  Status

## 2016-08-12 MED ORDER — DEXTROSE 5 % IV SOLN
2.0000 g | INTRAVENOUS | Status: AC
  Administered 2016-08-13: 2 g via INTRAVENOUS
  Filled 2016-08-12: qty 2

## 2016-08-13 ENCOUNTER — Ambulatory Visit: Payer: Medicare Other | Admitting: Anesthesiology

## 2016-08-13 ENCOUNTER — Encounter: Payer: Self-pay | Admitting: *Deleted

## 2016-08-13 ENCOUNTER — Ambulatory Visit
Admission: RE | Admit: 2016-08-13 | Discharge: 2016-08-13 | Disposition: A | Discharge status: Home / Self Care | Payer: Medicare Other | Source: Ambulatory Visit | Attending: Obstetrics and Gynecology | Admitting: Obstetrics and Gynecology

## 2016-08-13 ENCOUNTER — Encounter
Admission: RE | Disposition: A | Discharge status: Home / Self Care | Payer: Self-pay | Source: Ambulatory Visit | Attending: Obstetrics and Gynecology

## 2016-08-13 DIAGNOSIS — Z801 Family history of malignant neoplasm of trachea, bronchus and lung: Secondary | ICD-10-CM | POA: Insufficient documentation

## 2016-08-13 DIAGNOSIS — Z7951 Long term (current) use of inhaled steroids: Secondary | ICD-10-CM | POA: Insufficient documentation

## 2016-08-13 DIAGNOSIS — Z885 Allergy status to narcotic agent status: Secondary | ICD-10-CM | POA: Insufficient documentation

## 2016-08-13 DIAGNOSIS — Z9889 Other specified postprocedural states: Secondary | ICD-10-CM | POA: Insufficient documentation

## 2016-08-13 DIAGNOSIS — N840 Polyp of corpus uteri: Secondary | ICD-10-CM | POA: Insufficient documentation

## 2016-08-13 DIAGNOSIS — Z7982 Long term (current) use of aspirin: Secondary | ICD-10-CM | POA: Insufficient documentation

## 2016-08-13 DIAGNOSIS — N950 Postmenopausal bleeding: Secondary | ICD-10-CM | POA: Diagnosis not present

## 2016-08-13 DIAGNOSIS — Z87891 Personal history of nicotine dependence: Secondary | ICD-10-CM | POA: Insufficient documentation

## 2016-08-13 DIAGNOSIS — E119 Type 2 diabetes mellitus without complications: Secondary | ICD-10-CM | POA: Insufficient documentation

## 2016-08-13 DIAGNOSIS — Z888 Allergy status to other drugs, medicaments and biological substances status: Secondary | ICD-10-CM | POA: Insufficient documentation

## 2016-08-13 DIAGNOSIS — E785 Hyperlipidemia, unspecified: Secondary | ICD-10-CM | POA: Insufficient documentation

## 2016-08-13 DIAGNOSIS — I429 Cardiomyopathy, unspecified: Secondary | ICD-10-CM | POA: Insufficient documentation

## 2016-08-13 DIAGNOSIS — Z79899 Other long term (current) drug therapy: Secondary | ICD-10-CM | POA: Insufficient documentation

## 2016-08-13 DIAGNOSIS — Z9851 Tubal ligation status: Secondary | ICD-10-CM | POA: Insufficient documentation

## 2016-08-13 DIAGNOSIS — I509 Heart failure, unspecified: Secondary | ICD-10-CM | POA: Insufficient documentation

## 2016-08-13 DIAGNOSIS — Z791 Long term (current) use of non-steroidal anti-inflammatories (NSAID): Secondary | ICD-10-CM | POA: Insufficient documentation

## 2016-08-13 DIAGNOSIS — I110 Hypertensive heart disease with heart failure: Secondary | ICD-10-CM | POA: Insufficient documentation

## 2016-08-13 DIAGNOSIS — Z8249 Family history of ischemic heart disease and other diseases of the circulatory system: Secondary | ICD-10-CM | POA: Insufficient documentation

## 2016-08-13 DIAGNOSIS — Z853 Personal history of malignant neoplasm of breast: Secondary | ICD-10-CM | POA: Insufficient documentation

## 2016-08-13 DIAGNOSIS — J45909 Unspecified asthma, uncomplicated: Secondary | ICD-10-CM | POA: Insufficient documentation

## 2016-08-13 DIAGNOSIS — Z7984 Long term (current) use of oral hypoglycemic drugs: Secondary | ICD-10-CM | POA: Insufficient documentation

## 2016-08-13 DIAGNOSIS — Z7901 Long term (current) use of anticoagulants: Secondary | ICD-10-CM | POA: Insufficient documentation

## 2016-08-13 HISTORY — PX: DILATATION & CURETTAGE/HYSTEROSCOPY WITH MYOSURE: SHX6511

## 2016-08-13 LAB — GLUCOSE, CAPILLARY
GLUCOSE-CAPILLARY: 116 mg/dL — AB (ref 65–99)
Glucose-Capillary: 144 mg/dL — ABNORMAL HIGH (ref 65–99)

## 2016-08-13 LAB — ABO/RH: ABO/RH(D): O POS

## 2016-08-13 SURGERY — DILATATION & CURETTAGE/HYSTEROSCOPY WITH MYOSURE
Anesthesia: General | Wound class: Clean Contaminated

## 2016-08-13 MED ORDER — FENTANYL CITRATE (PF) 100 MCG/2ML IJ SOLN
INTRAMUSCULAR | Status: DC | PRN
  Administered 2016-08-13 (×4): 25 ug via INTRAVENOUS

## 2016-08-13 MED ORDER — SUCCINYLCHOLINE CHLORIDE 20 MG/ML IJ SOLN
INTRAMUSCULAR | Status: DC | PRN
  Administered 2016-08-13: 60 mg via INTRAVENOUS

## 2016-08-13 MED ORDER — CEFOXITIN SODIUM 2 G IV SOLR
INTRAVENOUS | Status: AC
  Filled 2016-08-13: qty 2

## 2016-08-13 MED ORDER — PROPOFOL 10 MG/ML IV BOLUS
INTRAVENOUS | Status: AC
  Filled 2016-08-13: qty 20

## 2016-08-13 MED ORDER — KETOROLAC TROMETHAMINE 30 MG/ML IJ SOLN
INTRAMUSCULAR | Status: AC
  Filled 2016-08-13: qty 1

## 2016-08-13 MED ORDER — GLYCOPYRROLATE 0.2 MG/ML IJ SOLN
INTRAMUSCULAR | Status: DC | PRN
  Administered 2016-08-13: 0.2 mg via INTRAVENOUS

## 2016-08-13 MED ORDER — ONDANSETRON HCL 4 MG/2ML IJ SOLN
INTRAMUSCULAR | Status: AC
  Filled 2016-08-13: qty 2

## 2016-08-13 MED ORDER — PHENYLEPHRINE HCL 10 MG/ML IJ SOLN
INTRAMUSCULAR | Status: DC | PRN
  Administered 2016-08-13 (×2): 100 ug via INTRAVENOUS
  Administered 2016-08-13: 200 ug via INTRAVENOUS

## 2016-08-13 MED ORDER — LIDOCAINE HCL (PF) 2 % IJ SOLN
INTRAMUSCULAR | Status: AC
  Filled 2016-08-13: qty 2

## 2016-08-13 MED ORDER — FAMOTIDINE 20 MG PO TABS
20.0000 mg | ORAL_TABLET | Freq: Once | ORAL | Status: AC
  Administered 2016-08-13: 20 mg via ORAL

## 2016-08-13 MED ORDER — SUCCINYLCHOLINE CHLORIDE 20 MG/ML IJ SOLN: INTRAMUSCULAR | Status: DC | PRN

## 2016-08-13 MED ORDER — FAMOTIDINE 20 MG PO TABS
ORAL_TABLET | ORAL | Status: AC
  Filled 2016-08-13: qty 1

## 2016-08-13 MED ORDER — SODIUM CHLORIDE 0.9 % IV SOLN
INTRAVENOUS | Status: DC
  Administered 2016-08-13: 14:00:00 via INTRAVENOUS

## 2016-08-13 MED ORDER — ONDANSETRON HCL 4 MG/2ML IJ SOLN: 4.0000 mg | Freq: Once | INTRAMUSCULAR | Status: DC | PRN

## 2016-08-13 MED ORDER — EPHEDRINE SULFATE 50 MG/ML IJ SOLN
INTRAMUSCULAR | Status: DC | PRN
  Administered 2016-08-13: 10 mg via INTRAVENOUS

## 2016-08-13 MED ORDER — DEXAMETHASONE SODIUM PHOSPHATE 10 MG/ML IJ SOLN
INTRAMUSCULAR | Status: AC
  Filled 2016-08-13: qty 1

## 2016-08-13 MED ORDER — FENTANYL CITRATE (PF) 100 MCG/2ML IJ SOLN: 25.0000 ug | INTRAMUSCULAR | Status: DC | PRN

## 2016-08-13 MED ORDER — LIDOCAINE HCL (CARDIAC) 20 MG/ML IV SOLN
INTRAVENOUS | Status: DC | PRN
  Administered 2016-08-13: 60 mg via INTRAVENOUS

## 2016-08-13 MED ORDER — SILVER NITRATE-POT NITRATE 75-25 % EX MISC
CUTANEOUS | Status: AC
  Filled 2016-08-13: qty 4

## 2016-08-13 MED ORDER — PROPOFOL 10 MG/ML IV BOLUS
INTRAVENOUS | Status: DC | PRN
  Administered 2016-08-13: 120 mg via INTRAVENOUS
  Administered 2016-08-13: 80 mg via INTRAVENOUS

## 2016-08-13 MED ORDER — DEXAMETHASONE SODIUM PHOSPHATE 10 MG/ML IJ SOLN
INTRAMUSCULAR | Status: DC | PRN
  Administered 2016-08-13: 4 mg via INTRAVENOUS

## 2016-08-13 MED ORDER — SOD CITRATE-CITRIC ACID 500-334 MG/5ML PO SOLN: 30.0000 mL | ORAL | Status: DC

## 2016-08-13 MED ORDER — LACTATED RINGERS IV SOLN: INTRAVENOUS | Status: DC

## 2016-08-13 MED ORDER — ONDANSETRON HCL 4 MG/2ML IJ SOLN
INTRAMUSCULAR | Status: DC | PRN
  Administered 2016-08-13: 4 mg via INTRAVENOUS

## 2016-08-13 MED ORDER — FENTANYL CITRATE (PF) 100 MCG/2ML IJ SOLN
INTRAMUSCULAR | Status: AC
  Filled 2016-08-13: qty 2

## 2016-08-13 MED ORDER — SEVOFLURANE IN SOLN
RESPIRATORY_TRACT | Status: AC
  Filled 2016-08-13: qty 250

## 2016-08-13 SURGICAL SUPPLY — 19 items
ABLATOR ENDOMETRIAL MYOSURE (ABLATOR) ×2 IMPLANT
CANISTER SUC SOCK COL 7IN (MISCELLANEOUS) ×2 IMPLANT
CANISTER SUCT 3000ML PPV (MISCELLANEOUS) ×2 IMPLANT
CATH ROBINSON RED A/P 16FR (CATHETERS) ×2 IMPLANT
DEVICE MYOSURE LITE (MISCELLANEOUS) IMPLANT
GLOVE BIO SURGEON STRL SZ8 (GLOVE) ×8 IMPLANT
GOWN STRL REUS W/ TWL LRG LVL3 (GOWN DISPOSABLE) ×1 IMPLANT
GOWN STRL REUS W/ TWL XL LVL3 (GOWN DISPOSABLE) ×1 IMPLANT
GOWN STRL REUS W/TWL LRG LVL3 (GOWN DISPOSABLE) ×1
GOWN STRL REUS W/TWL XL LVL3 (GOWN DISPOSABLE) ×1
KIT RM TURNOVER CYSTO AR (KITS) ×2 IMPLANT
PACK DNC HYST (MISCELLANEOUS) ×2 IMPLANT
PAD OB MATERNITY 4.3X12.25 (PERSONAL CARE ITEMS) ×2 IMPLANT
PAD PREP 24X41 OB/GYN DISP (PERSONAL CARE ITEMS) ×2 IMPLANT
SOL .9 NS 3000ML IRR  AL (IV SOLUTION) ×1
SOL .9 NS 3000ML IRR UROMATIC (IV SOLUTION) ×1 IMPLANT
TOWEL OR 17X26 4PK STRL BLUE (TOWEL DISPOSABLE) ×2 IMPLANT
TUBING CONNECTING 10 (TUBING) ×2 IMPLANT
TUBING HYSTEROSCOPY DOLPHIN (MISCELLANEOUS) ×2 IMPLANT

## 2016-08-13 NOTE — Transfer of Care (Signed)
Immediate Anesthesia Transfer of Care Note  Patient: [name]  Procedure(s) Performed: Procedure(s): DILATATION & CURETTAGE/HYSTEROSCOPY WITH MYOSURE RESECTION OF ENDOMETRIAL POLYP (N/A)  Patient Location: PACU  Anesthesia Type:General  Level of Consciousness: awake, alert , oriented and patient cooperative  Airway & Oxygen Therapy: Patient Spontanous Breathing and Patient connected to nasal cannula oxygen  Post-op Assessment: Report given to RN and Post -op Vital signs reviewed and stable  Post vital signs: Reviewed and stable  Last Vitals:  Vitals:   08/13/16 1216 08/13/16 1512  BP: (!) 156/69 112/90  Pulse: 88 97  Resp: 20 19  Temp: 36.6 C 36.4 C    Last Pain:  Vitals:   08/13/16 1512  TempSrc: Temporal  PainSc:          Complications: No apparent anesthesia complications

## 2016-08-13 NOTE — Anesthesia Procedure Notes (Signed)
Procedure Name: LMA Insertion Date/Time: 08/13/2016 2:21 PM Performed by: Darlyne Russian Pre-anesthesia Checklist: Patient identified, Suction available, Emergency Drugs available, Patient being monitored and Timeout performed Patient Re-evaluated:Patient Re-evaluated prior to inductionOxygen Delivery Method: Circle system utilized Preoxygenation: Pre-oxygenation with 100% oxygen Intubation Type: IV induction Ventilation: Mask ventilation without difficulty LMA: LMA inserted LMA Size: 4.0 Number of attempts: 1 Placement Confirmation: positive ETCO2 Dental Injury: Teeth and Oropharynx as per pre-operative assessment  Comments: Easy mask airway after induction, however, unable to open mouth to insert LMA after additional Propofol and Sevo.  Anectine was administered and then easily inserted LMA.

## 2016-08-13 NOTE — Discharge Instructions (Signed)
Dilation and Curettage or Vacuum Curettage, Care After °These instructions give you information about caring for yourself after your procedure. Your doctor may also give you more specific instructions. Call your doctor if you have any problems or questions after your procedure. °Follow these instructions at home: °Activity °· Do not drive or use heavy machinery while taking prescription pain medicine. °· For 24 hours after your procedure, avoid driving. °· Take short walks often, followed by rest periods. Ask your doctor what activities are safe for you. After one or two days, you may be able to return to your normal activities. °· Do not lift anything that is heavier than 10 lb (4.5 kg) until your doctor approves. °· For at least 2 weeks, or as long as told by your doctor: °? Do not douche. °? Do not use tampons. °? Do not have sex. °General instructions °· Take over-the-counter and prescription medicines only as told by your doctor. This is very important if you take blood thinning medicine. °· Do not take baths, swim, or use a hot tub until your doctor approves. Take showers instead of baths. °· Wear compression stockings as told by your doctor. °· It is up to you to get the results of your procedure. Ask your doctor when your results will be ready. °· Keep all follow-up visits as told by your doctor. This is important. °Contact a doctor if: °· You have very bad cramps that get worse or do not get better with medicine. °· You have very bad pain in your belly (abdomen). °· You cannot drink fluids without throwing up (vomiting). °· You get pain in a different part of the area between your belly and thighs (pelvis). °· You have bad-smelling discharge from your vagina. °· You have a rash. °Get help right away if: °· You are bleeding a lot from your vagina. A lot of bleeding means soaking more than one sanitary pad in an hour, for 2 hours in a row. °· You have clumps of blood (blood clots) coming from your  vagina. °· You have a fever or chills. °· Your belly feels very tender or hard. °· You have chest pain. °· You have trouble breathing. °· You cough up blood. °· You feel dizzy. °· You feel light-headed. °· You pass out (faint). °· You have pain in your neck or shoulder area. °Summary °· Take short walks often, followed by rest periods. Ask your doctor what activities are safe for you. After one or two days, you may be able to return to your normal activities. °· Do not lift anything that is heavier than 10 lb (4.5 kg) until your doctor approves. °· Do not take baths, swim, or use a hot tub until your doctor approves. Take showers instead of baths. °· Contact your doctor if you have any symptoms of infection, like bad-smelling discharge from your vagina. °This information is not intended to replace advice given to you by your health care provider. Make sure you discuss any questions you have with your health care provider. °Document Released: 12/09/2007 Document Revised: 11/17/2015 Document Reviewed: 11/17/2015 °Elsevier Interactive Patient Education © 2017 [employer] °AMBULATORY SURGERY  °DISCHARGE INSTRUCTIONS ° ° °1) The drugs that you were given will stay in your system until tomorrow so for the next 24 hours you should not: ° °A) Drive an automobile °B) Make any legal decisions °C) Drink any alcoholic beverage ° ° °2) You may resume regular meals tomorrow.  Today it is better to start   with liquids and gradually work up to solid foods. ° °You may eat anything you prefer, but it is better to start with liquids, then soup and crackers, and gradually work up to solid foods. ° ° °3) Please notify your doctor immediately if you have any unusual bleeding, trouble breathing, redness and pain at the surgery site, drainage, fever, or pain not relieved by medication. ° ° ° °4) Additional Instructions: ° ° ° ° ° ° ° °Please contact your physician with any problems or Same Day Surgery at [phone], Monday through  Friday 6 am to 4 pm, or [place] [facility] [place] Main number at [phone]. °

## 2016-08-13 NOTE — Brief Op Note (Signed)
08/13/2016  3:00 PM  PATIENT:  [name]  76 y.o. female  PRE-OPERATIVE DIAGNOSIS:  PMB  Endometrial Mass  POST-OPERATIVE DIAGNOSIS: endometrial polyps PROCEDURE:  Fractional dilation and curettage  Hysteroscopy  Myosure resection of large endometrial polyps  SURGEON:  Surgeon(s) and Role:    * Schermerhorn, Gwen Her, MD - Primary  PHYSICIAN ASSISTANT: Pa Student , Carolynn Serve  ASSISTANTS: none   ANESTHESIA:   LMA  EBL:  Total I/O In: 500 [I.V.:500] Out: 210 [Urine:200; Blood:10]  BLOOD ADMINISTERED:none  DRAINS: none   LOCAL MEDICATIONS USED:  NONE  SPECIMEN:  Source of Specimen:  ecc, endometrial curettings , endometrial poylps   DISPOSITION OF SPECIMEN:  PATHOLOGY  COUNTS:  YES  TOURNIQUET:  * No tourniquets in log *  DICTATION: .Other Dictation: Dictation Number verbal  PLAN OF CARE: Discharge to home after PACU  PATIENT DISPOSITION:  PACU - hemodynamically stable.   Delay start of Pharmacological VTE agent (>24hrs) due to surgical blood loss or risk of bleeding: not applicable

## 2016-08-13 NOTE — Anesthesia Post-op Follow-up Note (Cosign Needed)
Anesthesia QCDR form completed.        

## 2016-08-13 NOTE — Progress Notes (Signed)
Ready for Fx D+C , h/s  And myosure resection .  NPO  LAbs reviewed . All questions answered

## 2016-08-13 NOTE — Anesthesia Preprocedure Evaluation (Signed)
Anesthesia Evaluation  Patient identified by MRN, date of birth, ID band Patient awake    Reviewed: Allergy & Precautions, H&P , NPO status , Patient's Chart, lab work & pertinent test results, reviewed documented beta blocker date and time   Airway Mallampati: II  TM Distance: >3 FB Neck ROM: full    Dental  (+) Teeth Intact   Pulmonary neg pulmonary ROS, asthma , former smoker,    Pulmonary exam normal        Cardiovascular Exercise Tolerance: Good hypertension, +CHF  negative cardio ROS Normal cardiovascular exam Rate:Normal     Neuro/Psych negative neurological ROS  negative psych ROS   GI/Hepatic negative GI ROS, Neg liver ROS,   Endo/Other  negative endocrine ROSdiabetes  Renal/GU negative Renal ROS  negative genitourinary   Musculoskeletal   Abdominal   Peds  Hematology negative hematology ROS (+)   Anesthesia Other Findings   Reproductive/Obstetrics negative OB ROS                             Anesthesia Physical Anesthesia Plan  ASA: III  Anesthesia Plan: General LMA   Post-op Pain Management:    Induction:   Airway Management Planned:   Additional Equipment:   Intra-op Plan:   Post-operative Plan:   Informed Consent: I have reviewed the patients History and Physical, chart, labs and discussed the procedure including the risks, benefits and alternatives for the proposed anesthesia with the patient or authorized representative who has indicated his/her understanding and acceptance.     Plan Discussed with: CRNA  Anesthesia Plan Comments:         Anesthesia Quick Evaluation

## 2016-08-16 ENCOUNTER — Encounter: Payer: Self-pay | Admitting: Obstetrics and Gynecology

## 2016-08-16 NOTE — Anesthesia Postprocedure Evaluation (Signed)
Anesthesia Post Note  Patient: [name]  Procedure(s) Performed: Procedure(s) (LRB): DILATATION & CURETTAGE/HYSTEROSCOPY WITH MYOSURE RESECTION OF ENDOMETRIAL POLYP (N/A)  Patient location during evaluation: PACU Anesthesia Type: General Level of consciousness: awake and alert Pain management: pain level controlled Vital Signs Assessment: post-procedure vital signs reviewed and stable Respiratory status: spontaneous breathing, nonlabored ventilation, respiratory function stable and patient connected to nasal cannula oxygen Cardiovascular status: blood pressure returned to baseline and stable Postop Assessment: no signs of nausea or vomiting Anesthetic complications: no     Last Vitals:  Vitals:   08/13/16 1600 08/13/16 1609  BP: (!) 150/72 139/86  Pulse: 89   Resp: 15   Temp: (!) 36.1 C     Last Pain:  Vitals:   08/16/16 0908  TempSrc:   PainSc: 0-No pain                 Molli Barrows

## 2016-08-16 NOTE — Op Note (Signed)
NAME:  [name], [name]                      ACCOUNT NO.:  MEDICAL RECORD NO.:  [id number]  LOCATION:                                 FACILITY:  PHYSICIAN:  Laverta Baltimore, MD     DATE OF BIRTH:  DATE OF PROCEDURE: DATE OF DISCHARGE:                              OPERATIVE REPORT   PREOPERATIVE DIAGNOSIS: 1. Postmenopausal bleeding. 2. Endometrial polyps.  POSTOPERATIVE DIAGNOSIS: 1. Postmenopausal bleeding. 2. Endometrial polyps.  PROCEDURE PERFORMED: 1. Fractional dilation and curettage. 2. Hysteroscopy. 3. MyoSure resection of endometrial polyps.  SURGEON:  Laverta Baltimore, MD  ANESTHESIA:  LMA.  SURGEON:  Laverta Baltimore, MD.  FIRST ASSISTANT:  PA student, Si Raider.  INDICATIONS:  A 76 year old female with postmenopausal bleeding demonstrated large endometrial polyps 2 cm in size on saline infusion sonohysterography.  DESCRIPTION OF PROCEDURE:  After adequate LMA anesthesia, the patient was placed in dorsal supine position with the legs in candy-cane stirrups.  The patient's abdomen, perineum, and vagina were prepped and draped in normal sterile fashion.  Time-out was performed.  The patient did receive 2 g IV cefoxitin prior to commencement of the case. Straight catheterization of the bladder yielded 200 mL urine.  A weighted speculum was placed in the posterior vaginal vault and the anterior cervix was grasped with single-tooth tenaculum.  An endocervical curettage was performed followed by dilation of the endocervix to #17 Hanks dilator.  The hysteroscope was advanced into the endometrial cavity that demonstrated several large endometrial polyps. The large MyoSure resector was brought up to the operative field. Normal saline was used as distending medium.  The MyoSure resection took 1 minute of cutting time.  Pictures were taken.  Intraoperative pictures were taken.  Good hemostasis was noted.  The MyoSure was removed and endometrial curettage  was performed above this.  No additional tissue. Good hemostasis noted.  No complications.  ESTIMATED BLOOD LOSS:  Minimal.  URINE OUTPUT:  200.  INTRAOPERATIVE FLUIDS:  500 mL.  There was 2600 mL normal saline used during the MyoSure with a net deficit of 150 mL.  The patient was taken to recovery room in good condition.          ______________________________ Laverta Baltimore, MD     TS/MEDQ  D:  08/13/2016  T:  08/13/2016  Job:  [id number]

## 2016-08-17 LAB — SURGICAL PATHOLOGY

## 2017-06-01 ENCOUNTER — Other Ambulatory Visit: Payer: Self-pay | Admitting: Internal Medicine

## 2017-06-01 DIAGNOSIS — Z1231 Encounter for screening mammogram for malignant neoplasm of breast: Secondary | ICD-10-CM

## 2017-06-17 ENCOUNTER — Ambulatory Visit
Admission: RE | Admit: 2017-06-17 | Discharge: 2017-06-17 | Disposition: A | Discharge status: Home / Self Care | Payer: Medicare Other | Source: Ambulatory Visit | Attending: Internal Medicine | Admitting: Internal Medicine

## 2017-06-17 DIAGNOSIS — Z1231 Encounter for screening mammogram for malignant neoplasm of breast: Secondary | ICD-10-CM | POA: Insufficient documentation

## 2017-06-17 HISTORY — DX: Personal history of antineoplastic chemotherapy: Z92.21

## 2017-06-17 HISTORY — DX: Personal history of irradiation: Z92.3

## 2018-01-04 ENCOUNTER — Other Ambulatory Visit: Payer: Self-pay | Admitting: Internal Medicine

## 2018-01-04 DIAGNOSIS — R0602 Shortness of breath: Secondary | ICD-10-CM

## 2018-01-11 ENCOUNTER — Ambulatory Visit: Admission: RE | Admit: 2018-01-11 | Payer: Medicare Other | Source: Ambulatory Visit

## 2018-01-12 ENCOUNTER — Ambulatory Visit
Admission: RE | Admit: 2018-01-12 | Discharge: 2018-01-12 | Disposition: A | Discharge status: Home / Self Care | Payer: Medicare Other | Source: Ambulatory Visit | Attending: Internal Medicine | Admitting: Internal Medicine

## 2018-01-12 DIAGNOSIS — R0602 Shortness of breath: Secondary | ICD-10-CM | POA: Insufficient documentation

## 2018-01-12 DIAGNOSIS — R918 Other nonspecific abnormal finding of lung field: Secondary | ICD-10-CM | POA: Diagnosis present

## 2018-01-12 DIAGNOSIS — I700 Atherosclerosis of aorta: Secondary | ICD-10-CM | POA: Diagnosis not present

## 2018-08-03 ENCOUNTER — Other Ambulatory Visit: Payer: Self-pay | Admitting: Internal Medicine

## 2018-08-03 DIAGNOSIS — Z1231 Encounter for screening mammogram for malignant neoplasm of breast: Secondary | ICD-10-CM

## 2018-08-23 ENCOUNTER — Other Ambulatory Visit: Payer: Self-pay

## 2018-08-23 ENCOUNTER — Ambulatory Visit
Admission: RE | Admit: 2018-08-23 | Discharge: 2018-08-23 | Disposition: A | Discharge status: Home / Self Care | Payer: Medicare Other | Source: Ambulatory Visit | Attending: Internal Medicine | Admitting: Internal Medicine

## 2018-08-23 DIAGNOSIS — Z1231 Encounter for screening mammogram for malignant neoplasm of breast: Secondary | ICD-10-CM | POA: Insufficient documentation

## 2019-07-25 ENCOUNTER — Other Ambulatory Visit: Payer: Self-pay | Admitting: Sports Medicine

## 2019-07-25 DIAGNOSIS — M25562 Pain in left knee: Secondary | ICD-10-CM

## 2019-07-25 DIAGNOSIS — W19XXXA Unspecified fall, initial encounter: Secondary | ICD-10-CM

## 2019-07-25 DIAGNOSIS — M1712 Unilateral primary osteoarthritis, left knee: Secondary | ICD-10-CM

## 2019-08-05 ENCOUNTER — Other Ambulatory Visit: Payer: Self-pay

## 2019-08-05 ENCOUNTER — Ambulatory Visit
Admission: RE | Admit: 2019-08-05 | Discharge: 2019-08-05 | Disposition: A | Discharge status: Home / Self Care | Payer: Medicare Other | Source: Ambulatory Visit | Attending: Sports Medicine | Admitting: Sports Medicine

## 2019-08-05 DIAGNOSIS — M1712 Unilateral primary osteoarthritis, left knee: Secondary | ICD-10-CM | POA: Diagnosis not present

## 2019-08-05 DIAGNOSIS — W19XXXA Unspecified fall, initial encounter: Secondary | ICD-10-CM | POA: Insufficient documentation

## 2019-08-05 DIAGNOSIS — M25562 Pain in left knee: Secondary | ICD-10-CM

## 2019-09-27 ENCOUNTER — Other Ambulatory Visit: Payer: Self-pay | Admitting: Internal Medicine

## 2019-09-27 DIAGNOSIS — Z1231 Encounter for screening mammogram for malignant neoplasm of breast: Secondary | ICD-10-CM

## 2019-10-16 ENCOUNTER — Other Ambulatory Visit: Payer: Self-pay

## 2019-10-16 ENCOUNTER — Ambulatory Visit
Admission: RE | Admit: 2019-10-16 | Discharge: 2019-10-16 | Disposition: A | Discharge status: Home / Self Care | Payer: Medicare Other | Source: Ambulatory Visit | Attending: Internal Medicine | Admitting: Internal Medicine

## 2019-10-16 DIAGNOSIS — Z1231 Encounter for screening mammogram for malignant neoplasm of breast: Secondary | ICD-10-CM | POA: Diagnosis present

## 2020-08-21 ENCOUNTER — Other Ambulatory Visit: Payer: Self-pay

## 2020-08-21 ENCOUNTER — Encounter: Payer: Self-pay | Admitting: *Deleted

## 2020-08-21 ENCOUNTER — Encounter: Payer: Medicare Other | Attending: Internal Medicine | Admitting: *Deleted

## 2020-08-21 VITALS — BP 130/80 | Ht 64.0 in | Wt 193.1 lb

## 2020-08-21 DIAGNOSIS — E119 Type 2 diabetes mellitus without complications: Secondary | ICD-10-CM | POA: Insufficient documentation

## 2020-08-21 NOTE — Patient Instructions (Signed)
Check blood sugars 2 x day before breakfast and 2 hrs after supper 3 x week Bring blood sugar records to the next class  Exercise: Continue exercise program for    45  minutes   3  days a week and gradually increase for 150 minutes/week  Eat 3 meals day,   1-2  snacks a day Space meals 4-6 hours apart Don't skip meals - eat at least 1 protein and 1 serving of carbohydrate  Make an eye doctor appointment  Return for classes on:

## 2020-08-22 NOTE — Progress Notes (Signed)
Diabetes Self-Management Education  Visit Type: First/Initial  Appt. Start Time: 1525 Appt. End Time: 8182  08/21/2020  [name], identified by name and date of birth, is a 80 y.o. female with a diagnosis of Diabetes: Type 2.   ASSESSMENT  Blood pressure 130/80, height 5\' 4"  (1.626 m), weight 193 lb 1.6 oz (87.6 kg). Body mass index is 33.15 kg/m.   Diabetes Self-Management Education - 08/21/20 1657       Visit Information   Visit Type First/Initial      Initial Visit   Diabetes Type Type 2    Are you currently following a meal plan? No    Are you taking your medications as prescribed? Yes    Date Diagnosed 2006      Health Coping   How would you rate your overall health? Good      Psychosocial Assessment   Patient Belief/Attitude about Diabetes Motivated to manage diabetes   "will work with it"   Self-care barriers None    Self-management support Doctor's office;Family    Patient Concerns Nutrition/Meal planning;Medication;Monitoring;Healthy Lifestyle;Glycemic Control;Weight Control    Special Needs None    Preferred Learning Style Auditory;Visual;Hands on    Learning Readiness Ready    How often do you need to have someone help you when you read instructions, pamphlets, or other written materials from your doctor or pharmacy? 1 - Never    What is the last grade level you completed in school? 2 yrs college      Pre-Education Assessment   Patient understands the diabetes disease and treatment process. Needs Instruction    Patient understands incorporating nutritional management into lifestyle. Needs Instruction    Patient undertands incorporating physical activity into lifestyle. Needs Review    Patient understands using medications safely. Needs Instruction    Patient understands monitoring blood glucose, interpreting and using results Needs Review    Patient understands prevention, detection, and treatment of acute complications. Needs Instruction    Patient  understands prevention, detection, and treatment of chronic complications. Needs Review    Patient understands how to develop strategies to address psychosocial issues. Needs Instruction    Patient understands how to develop strategies to promote health/change behavior. Needs Instruction      Complications   Last HgB A1C per patient/outside source 8 %   07/16/2020   How often do you check your blood sugar? 0 times/day (not testing)   hasn't tested in last 2 weeks   Fasting Blood glucose range (mg/dL) 70-129   She reports 1 reading of 108 mg/dL 2 weeks ago.   Have you had a dilated eye exam in the past 12 months? No    Have you had a dental exam in the past 12 months? No    Are you checking your feet? Yes    How many days per week are you checking your feet? 7      Dietary Intake   Breakfast boiled eggs, sausage or bacon, toast, fruit    Lunch skips    Snack (afternoon) fruit (pear, orange, strawberries, banana, apple) or protein bar    Dinner chicken, fish, pork; potatoes, green beans, corn, rice, occasional peas and pasta, tomatoes, cuccumbers, squash, zucchini, onions, greens, carrots, frozen veggie medley    Beverage(s) water, coffee      Exercise   Exercise Type Light (walking / raking leaves)    How many days per week to you exercise? 3    How many minutes per day do  you exercise? 45    Total minutes per week of exercise 135      Patient Education   Previous Diabetes Education Yes (please comment)   "mabye [email or web address]"   Disease state  Definition of diabetes, type 1 and 2, and the diagnosis of diabetes;Explored patient's options for treatment of their diabetes    Nutrition management  Role of diet in the treatment of diabetes and the relationship between the three main macronutrients and blood glucose level;Food label reading, portion sizes and measuring food.;Reviewed blood glucose goals for pre and post meals and how to evaluate the patients' food intake on their blood glucose level.;Meal  timing in regards to the patients' current diabetes medication.    Physical activity and exercise  Role of exercise on diabetes management, blood pressure control and cardiac health.    Medications Reviewed patients medication for diabetes, action, purpose, timing of dose and side effects.    Monitoring Purpose and frequency of SMBG.;Taught/discussed recording of test results and interpretation of SMBG.;Identified appropriate SMBG and/or A1C goals.;Yearly dilated eye exam    Chronic complications Relationship between chronic complications and blood glucose control    Psychosocial adjustment Identified and addressed patients feelings and concerns about diabetes      Individualized Goals (developed by patient)   Reducing Risk Other (comment)   improve blood sugars, decrease medications, prevent diabetes complications, lose weight, lead a healthier lifestyle, become more fit     Outcomes   Expected Outcomes Demonstrated interest in learning. Expect positive outcomes             Individualized Plan for Diabetes Self-Management Training:   Learning Objective:  Patient will have a greater understanding of diabetes self-management. Patient education plan is to attend individual and/or group sessions per assessed needs and concerns.   Plan:   Patient Instructions  Check blood sugars 2 x day before breakfast and 2 hrs after supper 3 x week Bring blood sugar records to the next class  Exercise: Continue exercise program for    45  minutes   3  days a week and gradually increase for 150 minutes/week  Eat 3 meals day,   1-2  snacks a day Space meals 4-6 hours apart Don't skip meals - eat at least 1 protein and 1 serving of carbohydrate  Make an eye doctor appointment  Expected Outcomes:  Demonstrated interest in learning. Expect positive outcomes  Education material provided:  General Meal Planning Guidelines Simple Meal Plan Carb-Mindful Smoothie Handout  If problems or questions,  patient to contact team via:   Johny Drilling, RN, [place], [place] [phone]  Future DSME appointment:  August 28, 2020 for Diabetes Class 1

## 2020-08-28 ENCOUNTER — Encounter: Payer: Medicare Other | Admitting: Dietician

## 2020-08-28 ENCOUNTER — Other Ambulatory Visit: Payer: Self-pay

## 2020-08-28 ENCOUNTER — Encounter: Payer: Self-pay | Admitting: Dietician

## 2020-08-28 VITALS — Ht 64.0 in | Wt 193.2 lb

## 2020-08-28 DIAGNOSIS — E119 Type 2 diabetes mellitus without complications: Secondary | ICD-10-CM | POA: Diagnosis not present

## 2020-08-28 NOTE — Progress Notes (Signed)

## 2020-09-04 ENCOUNTER — Other Ambulatory Visit: Payer: Self-pay

## 2020-09-11 ENCOUNTER — Other Ambulatory Visit: Payer: Self-pay

## 2020-09-11 ENCOUNTER — Encounter: Payer: Medicare Other | Admitting: Dietician

## 2020-09-11 VITALS — BP 158/86 | Ht 64.0 in | Wt 192.4 lb

## 2020-09-11 DIAGNOSIS — E119 Type 2 diabetes mellitus without complications: Secondary | ICD-10-CM | POA: Diagnosis not present

## 2020-09-11 NOTE — Progress Notes (Signed)
Appt. Start Time: 0900 Appt. End Time: 1130  Class 3 Diabetes Overview - identify functions of pancreas and insulin; define insulin deficiency vs insulin resistance  Medications - state name, dose, timing of currently prescribed medications; describe types of medications available for diabetes  Psychosocial - identify DM as a source of stress; state the effects of stress on BG control; verbalize appropriate stress management techniques; identify personal stress issues   Nutritional Management - use food labels to identify serving size, content of carbohydrate, fiber, protein, fat, saturated fat and sodium; recognize food sources of fat, saturated fat, trans fat, and sodium, and verbalize goals for intake; describe healthful, appropriate food choices when dining out   Exercise - state a plan for personal exercise; verbalize contraindications for exercise  Self-Monitoring - state importance of SMBG; use SMBG results to effectively manage diabetes; identify importance of regular HbA1C testing and goals for results  Acute Complications - recognize hyperglycemia and hypoglycemia with causes and effects; identify blood glucose results as high, low or in control; list steps in treating and preventing high and low blood glucose  Chronic Complications - state importance of daily self-foot exams; explain appropriate eye and dental care  Lifestyle Changes/Goals & Health/Community Resources - set goals for proper diabetes care; state need for and frequency of healthcare follow-up; describe appropriate community resources for good health (ADA, web sites, apps)   Teaching Materials Used: Class 3 Slide Packet Diabetes Stress Test Stress Management Tools Stress Poem Goal Setting Worksheet Website/App List

## 2020-10-06 ENCOUNTER — Other Ambulatory Visit: Payer: Self-pay | Admitting: Internal Medicine

## 2020-10-06 DIAGNOSIS — Z1231 Encounter for screening mammogram for malignant neoplasm of breast: Secondary | ICD-10-CM

## 2020-10-09 ENCOUNTER — Telehealth: Payer: Self-pay | Admitting: *Deleted

## 2020-10-09 ENCOUNTER — Ambulatory Visit: Payer: Medicare Other

## 2020-10-09 NOTE — Telephone Encounter (Signed)
Patient didn't show for Diabetes Class 2 today. This was rescheduled from 09/04/2020. Attempted to contact patient but her phone is not set up for voice mail.

## 2020-10-16 ENCOUNTER — Other Ambulatory Visit: Payer: Self-pay

## 2020-10-16 ENCOUNTER — Ambulatory Visit
Admission: RE | Admit: 2020-10-16 | Discharge: 2020-10-16 | Disposition: A | Discharge status: Home / Self Care | Payer: Medicare Other | Source: Ambulatory Visit | Attending: Internal Medicine | Admitting: Internal Medicine

## 2020-10-16 DIAGNOSIS — Z1231 Encounter for screening mammogram for malignant neoplasm of breast: Secondary | ICD-10-CM | POA: Insufficient documentation

## 2020-10-28 ENCOUNTER — Encounter: Payer: Self-pay | Admitting: *Deleted

## 2021-01-02 ENCOUNTER — Telehealth: Payer: Self-pay | Admitting: Emergency Medicine

## 2021-01-02 ENCOUNTER — Ambulatory Visit
Admission: RE | Admit: 2021-01-02 | Discharge: 2021-01-02 | Disposition: A | Discharge status: Home / Self Care | Payer: Medicare Other | Source: Ambulatory Visit | Attending: *Deleted | Admitting: *Deleted

## 2021-01-02 ENCOUNTER — Other Ambulatory Visit: Payer: Self-pay

## 2021-01-02 ENCOUNTER — Ambulatory Visit
Admission: EM | Admit: 2021-01-02 | Discharge: 2021-01-02 | Disposition: A | Discharge status: Home / Self Care | Payer: Medicare Other | Attending: Emergency Medicine | Admitting: Emergency Medicine

## 2021-01-02 DIAGNOSIS — M7989 Other specified soft tissue disorders: Secondary | ICD-10-CM | POA: Insufficient documentation

## 2021-01-02 LAB — COMPREHENSIVE METABOLIC PANEL
ALT: 15 U/L (ref 0–44)
AST: 17 U/L (ref 15–41)
Albumin: 3.9 g/dL (ref 3.5–5.0)
Alkaline Phosphatase: 66 U/L (ref 38–126)
Anion gap: 7 (ref 5–15)
BUN: 16 mg/dL (ref 8–23)
CO2: 26 mmol/L (ref 22–32)
Calcium: 9 mg/dL (ref 8.9–10.3)
Chloride: 99 mmol/L (ref 98–111)
Creatinine, Ser: 0.6 mg/dL (ref 0.44–1.00)
GFR, Estimated: >60 mL/min (ref >60)
Glucose, Bld: 190 mg/dL — ABNORMAL HIGH (ref 70–99)
Potassium: 4 mmol/L (ref 3.5–5.1)
Sodium: 132 mmol/L — ABNORMAL LOW (ref 135–145)
Total Bilirubin: 0.4 mg/dL (ref 0.3–1.2)
Total Protein: 7.3 g/dL (ref 6.5–8.1)

## 2021-01-02 LAB — CBC WITH DIFFERENTIAL/PLATELET
Abs Immature Granulocytes: 0.01 10*3/uL (ref 0.00–0.07)
Basophils Absolute: 0 10*3/uL (ref 0.0–0.1)
Basophils Relative: 1 %
Eosinophils Absolute: 0.5 10*3/uL (ref 0.0–0.5)
Eosinophils Relative: 10 %
HCT: 38.3 % (ref 36.0–46.0)
Hemoglobin: 12.4 g/dL (ref 12.0–15.0)
Immature Granulocytes: 0 %
Lymphocytes Relative: 35 %
Lymphs Abs: 1.8 10*3/uL (ref 0.7–4.0)
MCH: 27.5 pg (ref 26.0–34.0)
MCHC: 32.4 g/dL (ref 30.0–36.0)
MCV: 84.9 fL (ref 80.0–100.0)
Monocytes Absolute: 0.4 10*3/uL (ref 0.1–1.0)
Monocytes Relative: 9 %
Neutro Abs: 2.3 10*3/uL (ref 1.7–7.7)
Neutrophils Relative %: 45 %
Platelets: 227 10*3/uL (ref 150–400)
RBC: 4.51 MIL/uL (ref 3.87–5.11)
RDW: 14.1 % (ref 11.5–15.5)
WBC: 5.1 10*3/uL (ref 4.0–10.5)
nRBC: 0 % (ref 0.0–0.2)

## 2021-01-02 MED ORDER — DOXYCYCLINE HYCLATE 100 MG PO CAPS: 100.0000 mg | ORAL_CAPSULE | Freq: Two times a day (BID) | ORAL | 0 refills | Status: DC

## 2021-01-02 MED ORDER — CEPHALEXIN 500 MG PO CAPS: 500.0000 mg | ORAL_CAPSULE | Freq: Four times a day (QID) | ORAL | 0 refills | Status: DC

## 2021-01-02 NOTE — ED Triage Notes (Signed)
Pt c/o small scratch to her right lower leg, with redness and itching/burning. Pt is unsure what scratched her. Pt is around cats that live outside but is certain they did not scratch her. Pt does work in her yard and was out there this past weekend with short pants on. Pt denies fever, body aches or other symptoms.

## 2021-01-02 NOTE — Telephone Encounter (Signed)
Spoke with pt and gave results and rx info. Pt voiced understanding. Nothing further needed at this time.

## 2021-01-02 NOTE — Telephone Encounter (Signed)
Patient had venous ultrasound conducted earlier with reassuring labs.  We will treat patient with doxycycline and Keflex for cellulitis of the right lower extremity.  Attempted to contact patient by phone and was unable to reach her.

## 2021-01-02 NOTE — Discharge Instructions (Signed)
We have ordered basic labs. Please schedule outpatient ultrasound on the right. I will keep your chart open and contact you with results.

## 2021-01-02 NOTE — ED Provider Notes (Signed)
[facility]  ____________________________________________  Time seen: Approximately 12:43 PM  I have reviewed the triage vital signs and the nursing notes.   HISTORY  Chief Complaint Leg Problem (Right, lower)   Historian Patient     HPI [name] is a 80 y.o. female with a history of breast cancer and elevated BMI, presents to the urgent care with erythema and swelling of the right lower extremity.  Patient states that affected area is pruritic.  She states that she worked outside last week and does have a small scratch but it had been healing well without complications.  She denies vesicle formation or expression of serosanguineous/purulent exudate.  Denies a history of prior DVT or PE.  She states that she did have some travel in September but nothing recent.  No recent surgeries.  She denies pleuritic chest pain, shortness of breath or cough.   Past Medical History:  Diagnosis Date   Asthma    Breast cancer ([place]) 2003`   right breast lumpectomy with chemo/rad   CHF (congestive heart failure) ([place])    Diabetes mellitus without complication ([place])    History of chemotherapy 2003   Hypercholesteremia    Hypertension    Personal history of chemotherapy 2003   right breast cancer    Personal history of radiation therapy 2003   right breast cancer      Immunizations up to date:  Yes.     Past Medical History:  Diagnosis Date   Asthma    Breast cancer ([place]) 2003`   right breast lumpectomy with chemo/rad   CHF (congestive heart failure) ([place])    Diabetes mellitus without complication ([place])    History of chemotherapy 2003   Hypercholesteremia    Hypertension    Personal history of chemotherapy 2003   right breast cancer    Personal history of radiation therapy 2003   right breast cancer     Patient Active Problem List   Diagnosis Date Noted   Vertigo 03/16/2015    Past Surgical History:  Procedure Laterality Date   BREAST BIOPSY Right 2003    positive   BREAST LUMPECTOMY Right 2003   right breast cancer    DILATATION & CURETTAGE/HYSTEROSCOPY WITH MYOSURE N/A 08/13/2016   Procedure: DILATATION & CURETTAGE/HYSTEROSCOPY WITH MYOSURE RESECTION OF ENDOMETRIAL POLYP;  Surgeon: Boykin Nearing, MD;  Location: [facility] ORS;  Service: Gynecology;  Laterality: N/A;   DILATION AND CURETTAGE OF UTERUS     PORT-A-CATH REMOVAL Left 2003    Prior to Admission medications   Medication Sig Start Date End Date Taking? Authorizing Provider  acetaminophen (TYLENOL) 500 MG tablet Take 500-1,000 mg by mouth every 6 (six) hours as needed (for pain.).   Yes [provider]  albuterol (VENTOLIN HFA) 108 (90 Base) MCG/ACT inhaler Inhale 2 puffs into the lungs 4 (four) times daily as needed. For wheezing. 08/05/14  Yes [provider]  aspirin EC 81 MG tablet Take 81 mg by mouth daily.   Yes [provider]  azelastine (ASTELIN) 0.1 % nasal spray Place 1 spray into both nostrils 2 (two) times daily as needed for allergies.   Yes [provider]  carvedilol (COREG) 12.5 MG tablet Take 1 tablet by mouth in the morning and at bedtime. 03/12/20 03/12/21 Yes [provider]  chlorthalidone (HYGROTON) 25 MG tablet Take 1 tablet by mouth daily. 12/17/20 12/17/21 Yes [provider]  glimepiride (AMARYL) 4 MG tablet Take 4 mg by mouth every evening.  Yes [provider]  meclizine (ANTIVERT) 25 MG tablet Take 1 tablet (25 mg total) by mouth 3 (three) times daily. Patient taking differently: Take 25 mg by mouth 3 (three) times daily as needed for dizziness or nausea. 03/17/15  Yes Epifanio Lesches, MD  meloxicam (MOBIC) 15 MG tablet Take 15 mg by mouth daily as needed for pain.  06/24/14  Yes [provider]  Multiple Vitamin (MULTI-VITAMINS) TABS Take 1 tablet by mouth daily.   Yes [provider]  pravastatin (PRAVACHOL) 20 MG tablet Take 20 mg by mouth daily. 07/24/20 07/24/21 Yes  [provider]  sacubitril-valsartan (ENTRESTO) 24-26 MG Take 1 tablet by mouth every 12 (twelve) hours. 01/17/20  Yes [provider]  spironolactone (ALDACTONE) 25 MG tablet Take 25 mg by mouth every morning. 01/24/15  Yes [provider]  cephALEXin (KEFLEX) 500 MG capsule Take 1 capsule (500 mg total) by mouth 4 (four) times daily. 01/02/21   Lannie Fields, PA-C  doxycycline (VIBRAMYCIN) 100 MG capsule Take 1 capsule (100 mg total) by mouth 2 (two) times daily. 01/02/21   Lannie Fields, PA-C    Allergies Codeine, Lovastatin, and Metformin  Family History  Problem Relation Age of Onset   Ovarian cancer Maternal Aunt    Breast cancer Paternal Aunt    Hypertension Mother     Social History Social History   Tobacco Use   Smoking status: Former    Packs/day: 1.00    Types: Cigarettes    Quit date: 11/13/1981    Years since quitting: 39.1   Smokeless tobacco: Never  Vaping Use   Vaping Use: Never used  Substance Use Topics   Alcohol use: Yes    Alcohol/week: 1.0 standard drink    Types: 1 Shots of liquor per week    Comment: social   Drug use: No     Review of Systems  Constitutional: No fever/chills Eyes:  No discharge ENT: No upper respiratory complaints. Respiratory: no cough. No SOB/ use of accessory muscles to breath Gastrointestinal:   No nausea, no vomiting.  No diarrhea.  No constipation. Musculoskeletal: Patient has leg pain and swelling.  Skin: Negative for rash, abrasions, lacerations, ecchymosis.   ____________________________________________   PHYSICAL EXAM:  VITAL SIGNS: ED Triage Vitals  Enc Vitals Group     BP 01/02/21 1232 (!) 149/73     Pulse Rate 01/02/21 1232 86     Resp 01/02/21 1232 18     Temp 01/02/21 1232 98 F (36.7 C)     Temp Source 01/02/21 1232 Oral     SpO2 01/02/21 1232 98 %     Weight 01/02/21 1229 190 lb (86.2 kg)     Height 01/02/21 1229 5\' 4"  (1.626 m)     Head Circumference --      Peak  Flow --      Pain Score 01/02/21 1229 0     Pain Loc --      Pain Edu? --      Excl. in [place]? --      Constitutional: Alert and oriented. Well appearing and in no acute distress. Eyes: Conjunctivae are normal. PERRL. EOMI. Head: Atraumatic. ENT:      Nose: No congestion/rhinnorhea.      Mouth/Throat: Mucous membranes are moist.  Neck: No stridor.  No cervical spine tenderness to palpation. Cardiovascular: Normal rate, regular rhythm. Normal S1 and S2.  Good peripheral circulation. Respiratory: Normal respiratory effort without tachypnea or retractions. Lungs CTAB. Good  air entry to the bases with no decreased or absent breath sounds Gastrointestinal: Bowel sounds x 4 quadrants. Soft and nontender to palpation. No guarding or rigidity. No distention. Musculoskeletal: Full range of motion to all extremities. No obvious deformities noted Neurologic:  Normal for age. No gross focal neurologic deficits are appreciated.  Skin: Patient has warmth and erythema of the right lower extremity along the anterior aspect of the right calf. Psychiatric: Mood and affect are normal for age. Speech and behavior are normal.   ____________________________________________   LABS (all labs ordered are listed, but only abnormal results are displayed)  Labs Reviewed  COMPREHENSIVE METABOLIC PANEL - Abnormal; Notable for the following components:      Result Value   Sodium 132 (*)    Glucose, Bld 190 (*)    All other components within normal limits  CBC WITH DIFFERENTIAL/PLATELET   ____________________________________________  EKG   ____________________________________________  RADIOLOGY Unk Pinto, personally viewed and evaluated these images (plain radiographs) as part of my medical decision making, as well as reviewing the written report by the radiologist.    US Venous Img Lower Unilateral Right (DVT)  Result Date: 01/02/2021 CLINICAL DATA:  Acute right lower extremity swelling.  EXAM: Right LOWER EXTREMITY VENOUS DOPPLER ULTRASOUND TECHNIQUE: Gray-scale sonography with compression, as well as color and duplex ultrasound, were performed to evaluate the deep venous system(s) from the level of the common femoral vein through the popliteal and proximal calf veins. COMPARISON:  None. FINDINGS: VENOUS Normal compressibility of the common femoral, superficial femoral, and popliteal veins, as well as the visualized calf veins. Visualized portions of profunda femoral vein and great saphenous vein unremarkable. No filling defects to suggest DVT on grayscale or color Doppler imaging. Doppler waveforms show normal direction of venous flow, normal respiratory plasticity and response to augmentation. Limited views of the contralateral common femoral vein are unremarkable. OTHER None. Limitations: none IMPRESSION: Negative. Electronically Signed   By: Marijo Conception M.D.   On: 01/02/2021 15:25    ____________________________________________    PROCEDURES  Procedure(s) performed:     Procedures     Medications - No data to display   ____________________________________________   INITIAL IMPRESSION / ASSESSMENT AND PLAN / ED COURSE  Pertinent labs & imaging results that were available during my care of the patient were reviewed by me and considered in my medical decision making (see chart for details).      Assessment and Plan: Leg pain: Erythema:  80 year old female presents to the urgent care with right lower extremity pain and erythema.  Given history of breast cancer, venous ultrasound was obtained which showed no signs of DVT.  Patient had mild hyponatremia but CMP was otherwise reassuring.  Attempted to contact patient by phone as well as patient's sister in order to reach her.  Plan is to treat her with doxycycline and Keflex and medications were sent to patient's pharmacy.   ____________________________________________  FINAL CLINICAL IMPRESSION(S) / ED  DIAGNOSES  Final diagnoses:  Leg swelling      NEW MEDICATIONS STARTED DURING THIS VISIT:  ED Discharge Orders          Ordered    US Venous Img Lower Unilateral Right (DVT)  Status:  Canceled        01/02/21 1240    US Venous Img Lower Unilateral Right (DVT)        01/02/21 1255  This chart was dictated using voice recognition software/Dragon. Despite best efforts to proofread, errors can occur which can change the meaning. Any change was purely unintentional.     Lannie Fields, PA-C 01/02/21 1555

## 2021-02-03 IMAGING — MR MR KNEE*L* W/O CM
7 series · 40 of 40 positions shown · non-contrast
Comparison: None.

CLINICAL DATA: Generalized knee pain and instability since a fall
June 1998.

EXAM:
MRI OF THE LEFT KNEE WITHOUT CONTRAST
TECHNIQUE: Multiplanar, multisequence MR imaging of the knee was performed. No
intravenous contrast was administered.

[Series 8: T2 fat-sat · axial · left · 4.0mm · 0.50mm/px · z∈[-79,+46]mm · 6 of 26 slices shown (1 of 3)]
[im 1/26]
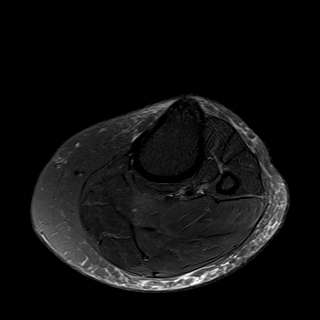
[im 6/26]
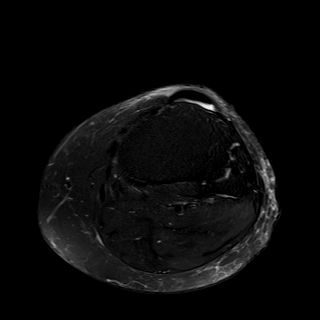
[im 11/26]
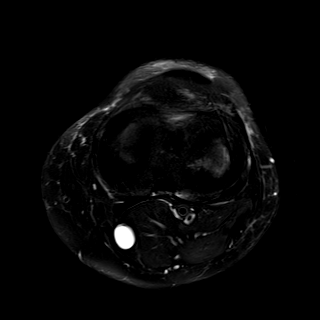
[im 16/26]
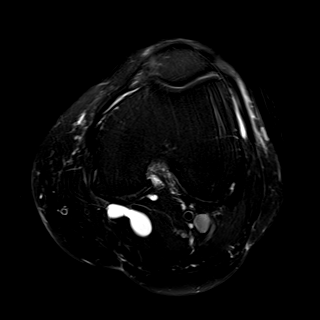
[im 21/26]
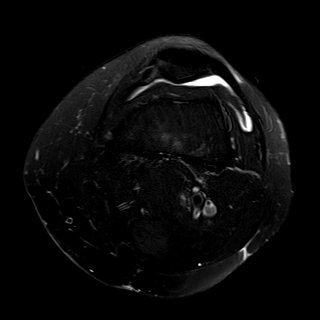
[im 26/26]
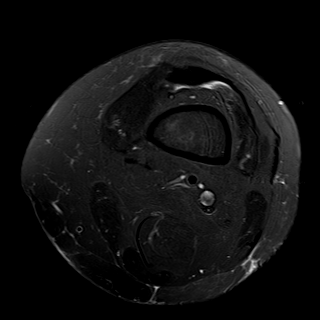

[Series 9: T2 fat-sat · coronal · left · 4.0mm · 0.59mm/px · 5 of 26 slices shown (2 of 3)]
[im 1/26]
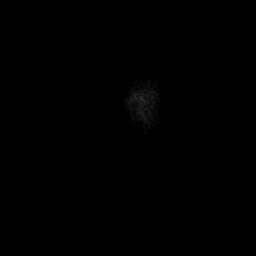
[im 7/26]
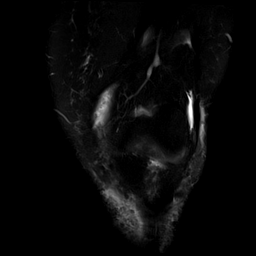
[im 13/26]
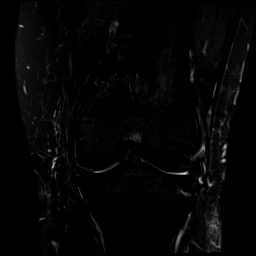
[im 19/26]
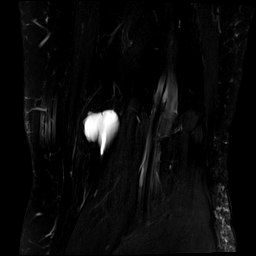
[im 26/26]
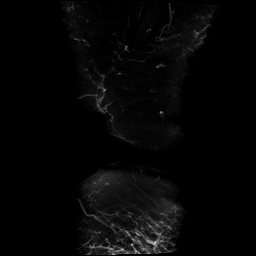

[Series 10: T1 · coronal · left · 4.0mm · 0.59mm/px · 6 of 30 slices shown]
[im 1/30]
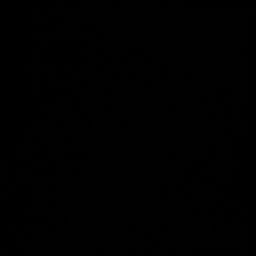
[im 6/30]
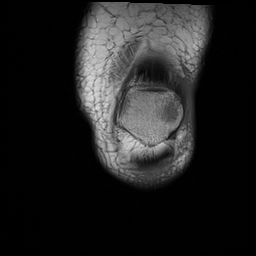
[im 12/30]
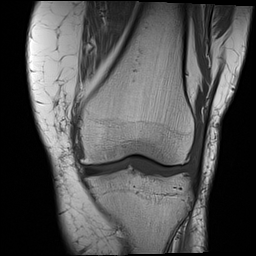
[im 18/30]
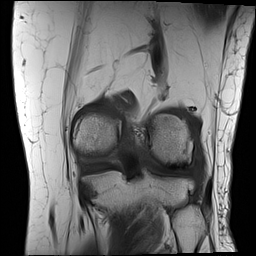
[im 24/30]
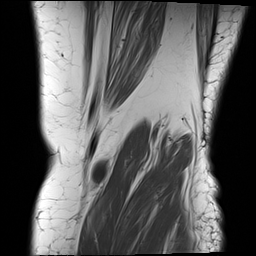
[im 30/30]
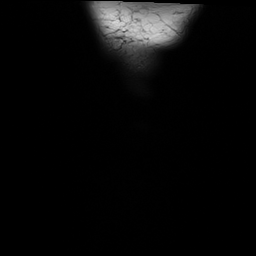

[Series 11: PD fat-sat · coronal · left · 4.0mm · 0.59mm/px · 6 of 30 slices shown (1 of 2)]
[im 1/30]
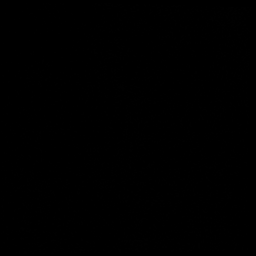
[im 6/30]
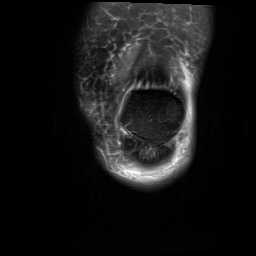
[im 12/30]
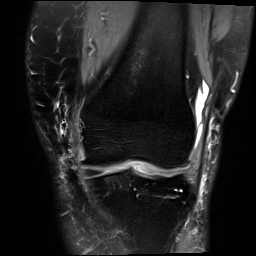
[im 18/30]
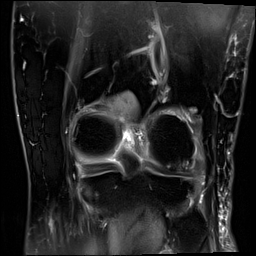
[im 24/30]
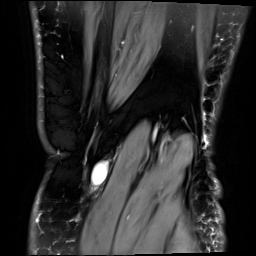
[im 30/30]
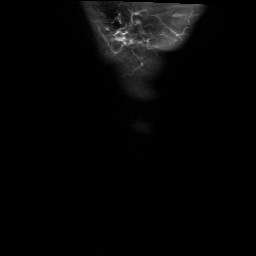

[Series 12: PD fat-sat · sagittal · left · 3.0mm · 0.59mm/px · 7 of 36 slices shown (2 of 2)]
[im 1/36]
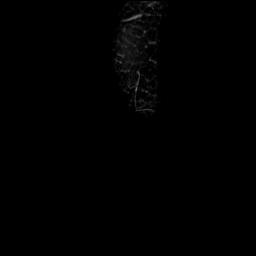
[im 6/36]
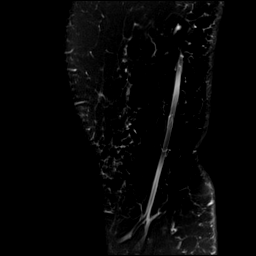
[im 12/36]
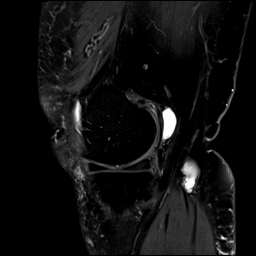
[im 18/36]
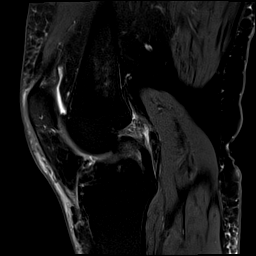
[im 24/36]
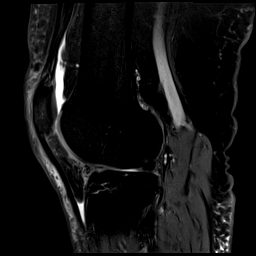
[im 30/36]
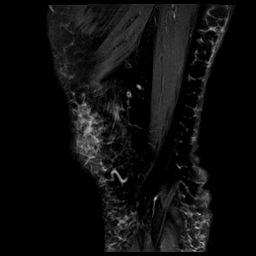
[im 36/36]
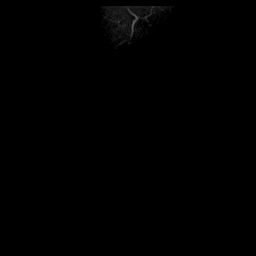

[Series 13: T2 fat-sat · sagittal · left · 3.0mm · 0.59mm/px · 7 of 36 slices shown (3 of 3)]
[im 1/36]
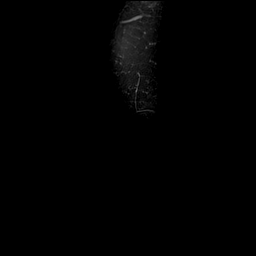
[im 6/36]
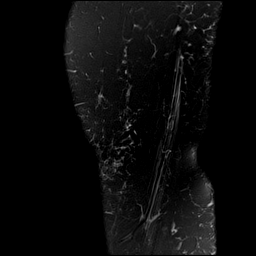
[im 12/36]
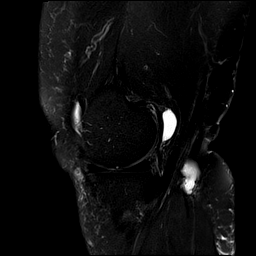
[im 18/36]
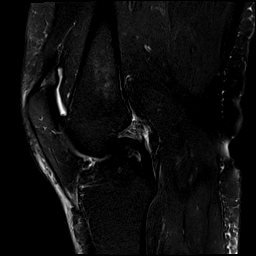
[im 24/36]
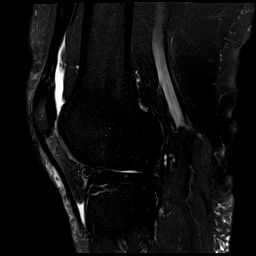
[im 30/36]
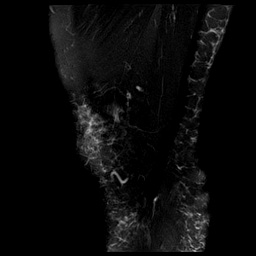
[im 36/36]
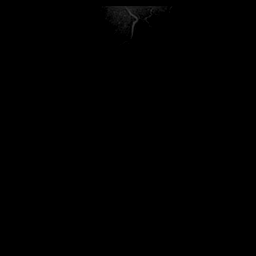

[Series 14: PD · coronal · left · 2.0mm · 0.47mm/px · 3 of 16 slices shown]
[im 1/16]
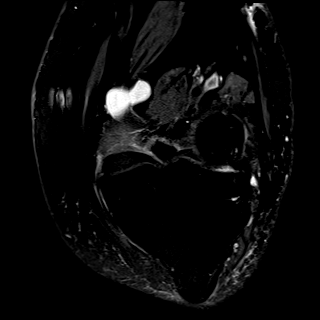
[im 8/16]
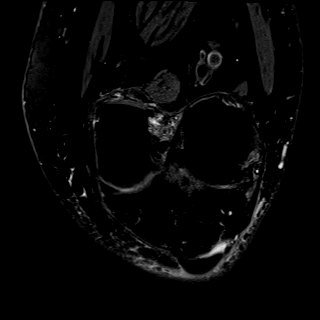
[im 16/16]
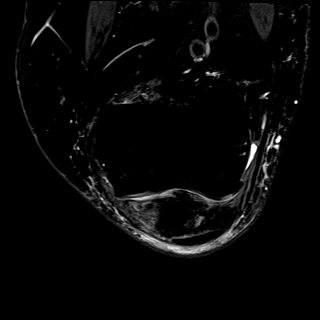

[40 of 40 positions shown; findings below may reference images not displayed]

FINDINGS: MENISCI

Medial meniscus:  Mild degenerative changes but meniscal tear.

Lateral meniscus:  Degenerated and torn anterior horn.

LIGAMENTS

Cruciates:  Intact. Mild mucoid degeneration of the ACL.

Collaterals:  Intact

CARTILAGE

Patellofemoral: Mild degenerative chondrosis. No full-thickness
cartilage defects or osteochondral abnormality.

Medial: Mild to moderate degenerative chondrosis. Early joint space
narrowing and spurring.

Lateral: Moderate to advanced degenerative chondrosis with areas of
full or near full-thickness cartilage loss, joint space narrowing
and spurring.

Joint:  Small joint effusion.

Popliteal Fossa:  Small to moderate-sized Baker's cyst.

Extensor Mechanism: The patella retinacular structures are intact
and the quadriceps and patellar tendons are intact.

Bones:  No acute bony findings. No bone contusion or marrow edema.

Other: Normal knee musculature.
IMPRESSION: 1. Degenerated and torn anterior horn of the lateral meniscus.
2. Intact ligamentous structures and no acute bony findings. Mild
mucoid degeneration of the ACL.
3. Tricompartmental degenerative changes most significant in the
lateral compartment.
4. Small joint effusion and small to moderate-sized Baker's cyst.

## 2021-09-25 ENCOUNTER — Encounter: Payer: Self-pay | Admitting: Emergency Medicine

## 2021-09-25 ENCOUNTER — Ambulatory Visit
Admission: EM | Admit: 2021-09-25 | Discharge: 2021-09-25 | Disposition: A | Discharge status: Home / Self Care | Payer: Medicare Other | Attending: Urgent Care | Admitting: Urgent Care

## 2021-09-25 DIAGNOSIS — K029 Dental caries, unspecified: Secondary | ICD-10-CM | POA: Diagnosis not present

## 2021-09-25 DIAGNOSIS — S025XXB Fracture of tooth (traumatic), initial encounter for open fracture: Secondary | ICD-10-CM

## 2021-09-25 MED ORDER — AMOXICILLIN-POT CLAVULANATE 875-125 MG PO TABS: 1.0000 | ORAL_TABLET | Freq: Two times a day (BID) | ORAL | 0 refills | Status: AC

## 2021-09-25 MED ORDER — LIDOCAINE VISCOUS HCL 2 % MT SOLN: 15.0000 mL | OROMUCOSAL | 0 refills | Status: AC | PRN

## 2021-09-25 NOTE — Discharge Instructions (Addendum)
Start taking the antibiotic twice daily with food to prevent upset stomach. Continue using mouthwash daily. Use the topical lidocaine jelly to affected area of mouth up to every 4-6 hours as needed.  Keep appointment with dentist on 26th. Head to ER if any fever or facial swelling.

## 2021-09-25 NOTE — ED Provider Notes (Signed)
[facility]    CSN: [id number] Arrival date & time: 09/25/21  1301      History   Chief Complaint Chief Complaint  Patient presents with   Dental Pain    HPI [name] is a 81 y.o. female.   Pleasant 81 year old female presents today due to concerns of dental pain to 2 of her upper front teeth.  She went to a dentist on Monday, but was told that they did not accept her insurance.  She has a cracked and broken tooth for the past 7 months, but states recently it is increased in pain.  She has an appointment on 10/07/2021 with a new dentist, but states the pain is unbearable.  She believes she is starting to develop an abscess to the right incisor region.  She has been taking her meloxicam and ibuprofen without resolution to the pain.  She denies fever or facial swelling. Has taken amoxicillin in the past for similar sx with good resolution.   Dental Pain   Past Medical History:  Diagnosis Date   Asthma    Breast cancer ([place]) 2003`   right breast lumpectomy with chemo/rad   CHF (congestive heart failure) ([place])    Diabetes mellitus without complication ([place])    History of chemotherapy 2003   Hypercholesteremia    Hypertension    Personal history of chemotherapy 2003   right breast cancer    Personal history of radiation therapy 2003   right breast cancer     Patient Active Problem List   Diagnosis Date Noted   Vertigo 03/16/2015    Past Surgical History:  Procedure Laterality Date   BREAST BIOPSY Right 2003   positive   BREAST LUMPECTOMY Right 2003   right breast cancer    DILATATION & CURETTAGE/HYSTEROSCOPY WITH MYOSURE N/A 08/13/2016   Procedure: DILATATION & CURETTAGE/HYSTEROSCOPY WITH MYOSURE RESECTION OF ENDOMETRIAL POLYP;  Surgeon: Boykin Nearing, MD;  Location: [facility] ORS;  Service: Gynecology;  Laterality: N/A;   DILATION AND CURETTAGE OF UTERUS     PORT-A-CATH REMOVAL Left 2003    OB History   No obstetric history on file.       Home Medications    Prior to Admission medications   Medication Sig Start Date End Date Taking? Authorizing Provider  amoxicillin-clavulanate (AUGMENTIN) 875-125 MG tablet Take 1 tablet by mouth 2 (two) times daily with a meal for 10 days. 09/25/21 10/05/21 Yes Daven Pinckney L, PA  lidocaine (XYLOCAINE) 2 % solution Use as directed 15 mLs in the mouth or throat as needed for mouth pain. 09/25/21  Yes Revan Gendron L, PA  acetaminophen (TYLENOL) 500 MG tablet Take 500-1,000 mg by mouth every 6 (six) hours as needed (for pain.).    [provider]  albuterol (VENTOLIN HFA) 108 (90 Base) MCG/ACT inhaler Inhale 2 puffs into the lungs 4 (four) times daily as needed. For wheezing. 08/05/14   [provider]  aspirin EC 81 MG tablet Take 81 mg by mouth daily.    [provider]  azelastine (ASTELIN) 0.1 % nasal spray Place 1 spray into both nostrils 2 (two) times daily as needed for allergies.    [provider]  carvedilol (COREG) 12.5 MG tablet Take 1 tablet by mouth in the morning and at bedtime. 03/12/20 03/12/21  [provider]  chlorthalidone (HYGROTON) 25 MG tablet Take 1 tablet by mouth daily. 12/17/20 12/17/21  [provider]  glimepiride (AMARYL) 4 MG tablet Take 4 mg by mouth  every evening.    [provider]  meclizine (ANTIVERT) 25 MG tablet Take 1 tablet (25 mg total) by mouth 3 (three) times daily. Patient taking differently: Take 25 mg by mouth 3 (three) times daily as needed for dizziness or nausea. 03/17/15   Epifanio Lesches, MD  meloxicam (MOBIC) 15 MG tablet Take 15 mg by mouth daily as needed for pain.  06/24/14   [provider]  Multiple Vitamin (MULTI-VITAMINS) TABS Take 1 tablet by mouth daily.    [provider]  pravastatin (PRAVACHOL) 20 MG tablet Take 20 mg by mouth daily. 07/24/20 07/24/21  [provider]  sacubitril-valsartan (ENTRESTO) 24-26 MG Take 1 tablet by mouth every 12  (twelve) hours. 01/17/20   [provider]  spironolactone (ALDACTONE) 25 MG tablet Take 25 mg by mouth every morning. 01/24/15   [provider]    Family History Family History  Problem Relation Age of Onset   Ovarian cancer Maternal Aunt    Breast cancer Paternal Aunt    Hypertension Mother     Social History Social History   Tobacco Use   Smoking status: Former    Packs/day: 1.00    Types: Cigarettes    Quit date: 11/13/1981    Years since quitting: 39.8   Smokeless tobacco: Never  Vaping Use   Vaping Use: Never used  Substance Use Topics   Alcohol use: Yes    Alcohol/week: 1.0 standard drink of alcohol    Types: 1 Shots of liquor per week    Comment: social   Drug use: No     Allergies   Codeine, Lovastatin, and Metformin   Review of Systems Review of Systems As per HPI  Physical Exam Triage Vital Signs ED Triage Vitals  Enc Vitals Group     BP 09/25/21 1319 (!) 152/84     Pulse Rate 09/25/21 1319 80     Resp 09/25/21 1319 14     Temp 09/25/21 1319 97.8 F (36.6 C)     Temp Source 09/25/21 1319 Oral     SpO2 09/25/21 1319 96 %     Weight 09/25/21 1317 183 lb (83 kg)     Height 09/25/21 1317 '5\' 4"'$  (1.626 m)     Head Circumference --      Peak Flow --      Pain Score 09/25/21 1317 5     Pain Loc --      Pain Edu? --      Excl. in [place]? --    No data found.  Updated Vital Signs BP (!) 152/84 (BP Location: Left Arm)   Pulse 80   Temp 97.8 F (36.6 C) (Oral)   Resp 14   Ht '5\' 4"'$  (1.626 m)   Wt 183 lb (83 kg)   SpO2 96%   BMI 31.41 kg/m   Visual Acuity Right Eye Distance:   Left Eye Distance:   Bilateral Distance:    Right Eye Near:   Left Eye Near:    Bilateral Near:     Physical Exam Vitals and nursing note reviewed.  Constitutional:      General: She is not in acute distress.    Appearance: Normal appearance. She is obese. She is not ill-appearing, toxic-appearing or diaphoretic.  HENT:     Head: Normocephalic  and atraumatic.     Right Ear: External ear normal.     Left Ear: External ear normal.     Nose: Nose normal. No congestion or  rhinorrhea.     Right Sinus: No maxillary sinus tenderness or frontal sinus tenderness.     Left Sinus: No maxillary sinus tenderness or frontal sinus tenderness.     Mouth/Throat:     Lips: Pink.     Mouth: Mucous membranes are moist.     Dentition: Abnormal dentition. Has dentures. Dental tenderness, gingival swelling and dental caries present. No dental abscesses.     Tongue: No lesions.     Pharynx: Oropharynx is clear. Uvula midline. No pharyngeal swelling, posterior oropharyngeal erythema or uvula swelling.   Neurological:     Mental Status: She is alert.      UC Treatments / Results  Labs (all labs ordered are listed, but only abnormal results are displayed) Labs Reviewed - No data to display  EKG   Radiology No results found.  Procedures Procedures (including critical care time)  Medications Ordered in UC Medications - No data to display  Initial Impression / Assessment and Plan / UC Course  I have reviewed the triage vital signs and the nursing notes.  Pertinent labs & imaging results that were available during my care of the patient were reviewed by me and considered in my medical decision making (see chart for details).     Pain due to dental caries- pt with cracked and worn down teeth to bilateral canines. Partial dentures in place. Had good response in the past to amoxicillin. Has been doing NSAIDs for pain control. Topical viscous lido jelly PRN. Keep appointment with dentist on 10/07/21. ER if any fever or facial swelling.   Final Clinical Impressions(s) / UC Diagnoses   Final diagnoses:  Pain due to dental caries  Open fracture of tooth, initial encounter     Discharge Instructions      Start taking the antibiotic twice daily with food to prevent upset stomach. Continue using mouthwash daily. Use the topical lidocaine  jelly to affected area of mouth up to every 4-6 hours as needed.  Keep appointment with dentist on 26th. Head to ER if any fever or facial swelling.     ED Prescriptions     Medication Sig Dispense Auth. Provider   amoxicillin-clavulanate (AUGMENTIN) 875-125 MG tablet Take 1 tablet by mouth 2 (two) times daily with a meal for 10 days. 20 tablet Rumi Kolodziej L, PA   lidocaine (XYLOCAINE) 2 % solution Use as directed 15 mLs in the mouth or throat as needed for mouth pain. 15 mL Giannina Bartolome L, PA      PDMP not reviewed this encounter.   Chaney Malling, [place] 09/25/21 1339

## 2021-09-25 NOTE — ED Triage Notes (Signed)
Patient c/o upper teeth pain that for a week.  Patient denies fevers or chills.

## 2021-12-07 ENCOUNTER — Other Ambulatory Visit: Payer: Self-pay | Admitting: Internal Medicine

## 2021-12-07 DIAGNOSIS — Z1231 Encounter for screening mammogram for malignant neoplasm of breast: Secondary | ICD-10-CM

## 2021-12-28 ENCOUNTER — Ambulatory Visit
Admission: RE | Admit: 2021-12-28 | Discharge: 2021-12-28 | Disposition: A | Discharge status: Home / Self Care | Payer: Medicare Other | Source: Ambulatory Visit | Attending: Internal Medicine | Admitting: Internal Medicine

## 2021-12-28 DIAGNOSIS — Z1231 Encounter for screening mammogram for malignant neoplasm of breast: Secondary | ICD-10-CM | POA: Insufficient documentation

## 2022-05-20 ENCOUNTER — Other Ambulatory Visit: Payer: Self-pay | Admitting: Internal Medicine

## 2022-05-20 DIAGNOSIS — R1031 Right lower quadrant pain: Secondary | ICD-10-CM

## 2022-06-04 ENCOUNTER — Ambulatory Visit
Admission: RE | Admit: 2022-06-04 | Discharge: 2022-06-04 | Disposition: A | Discharge status: Home / Self Care | Payer: Medicare Other | Source: Ambulatory Visit | Attending: Internal Medicine | Admitting: Internal Medicine

## 2022-06-04 DIAGNOSIS — R1031 Right lower quadrant pain: Secondary | ICD-10-CM | POA: Insufficient documentation

## 2022-06-04 MED ORDER — IOHEXOL 300 MG/ML  SOLN
100.0000 mL | Freq: Once | INTRAMUSCULAR | Status: AC | PRN
  Administered 2022-06-04: 100 mL via INTRAVENOUS

## 2022-07-04 IMAGING — US US EXTREM LOW VENOUS*R*
1 series · 14 of 24 positions shown · non-contrast
Comparison: None.

CLINICAL DATA: Acute right lower extremity swelling.

EXAM:
Right LOWER EXTREMITY VENOUS DOPPLER ULTRASOUND
TECHNIQUE: Gray-scale sonography with compression, as well as color and duplex
ultrasound, were performed to evaluate the deep venous system(s)
from the level of the common femoral vein through the popliteal and
proximal calf veins.

[Series 1: us venous img lower uni right (dvt) · portal-venous · 14 of 32 slices shown]
[im 1/32]
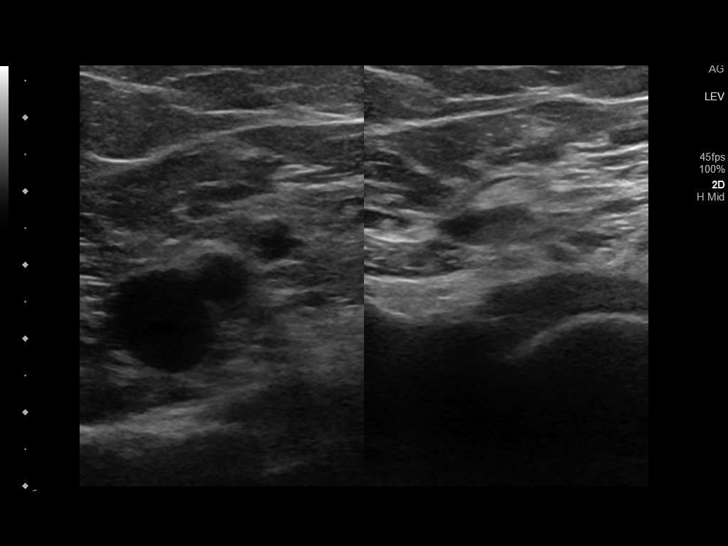
[im 3/32]
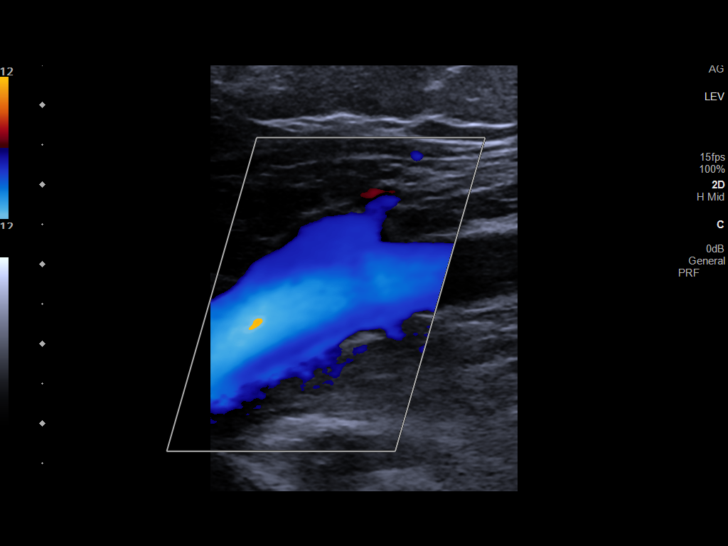
[im 6/32]
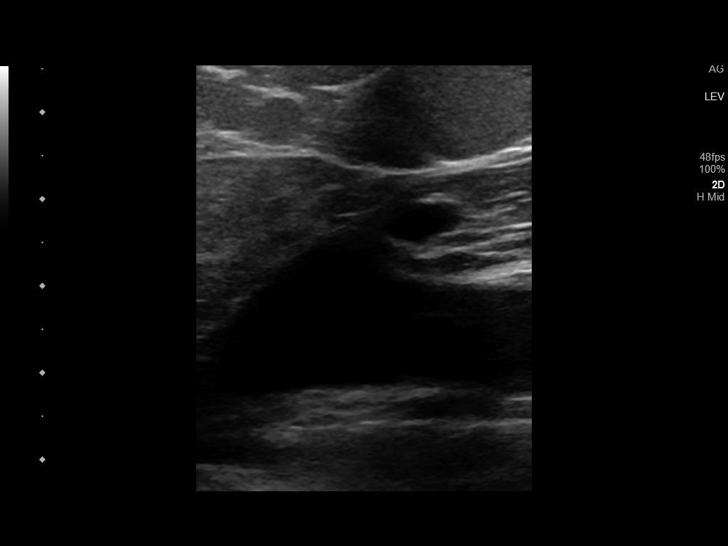
[im 9/32]
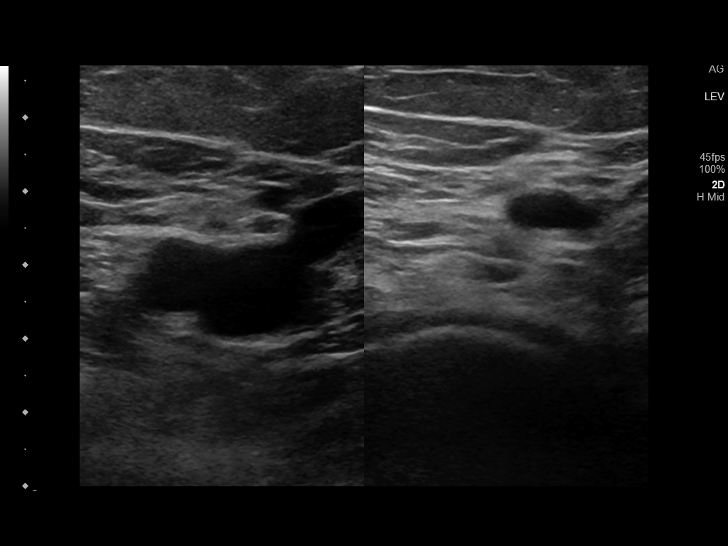
[im 10/32]
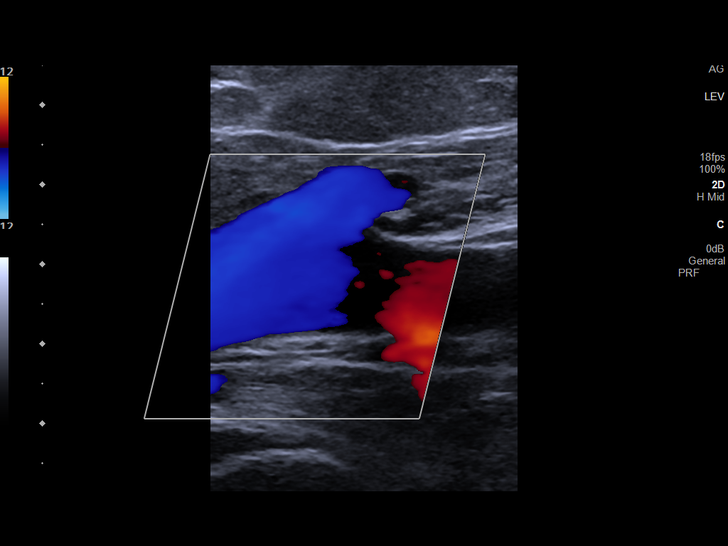
[im 13/32]
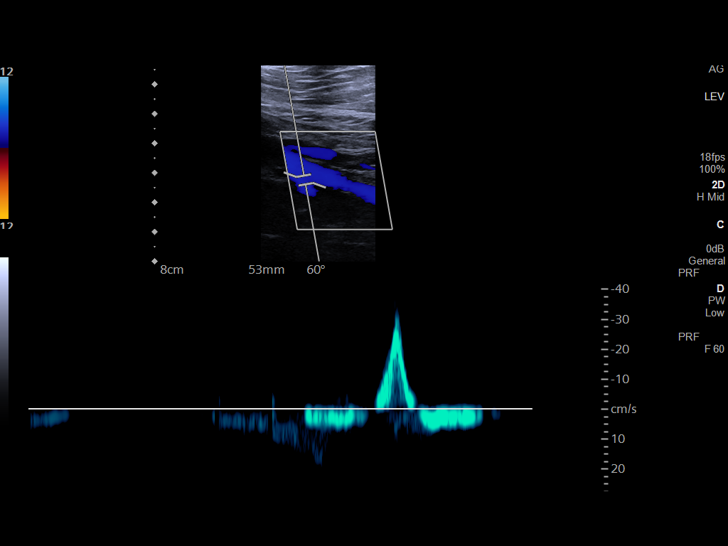
[im 15/32]
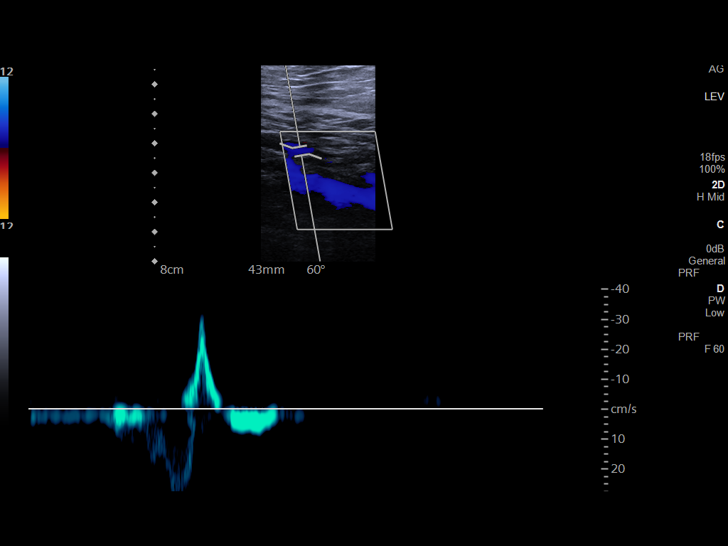
[im 17/32]
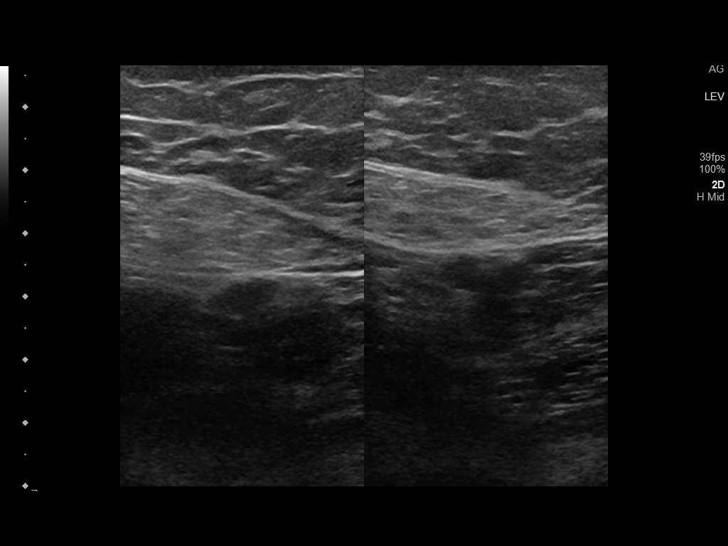
[im 19/32]
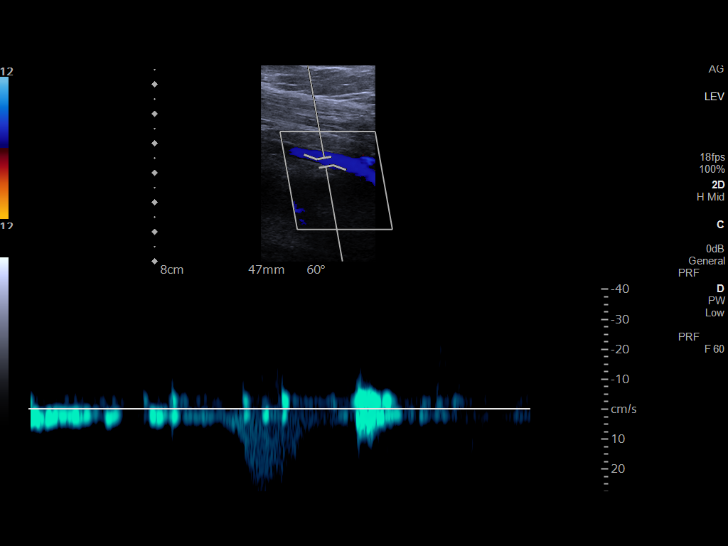
[im 22/32]
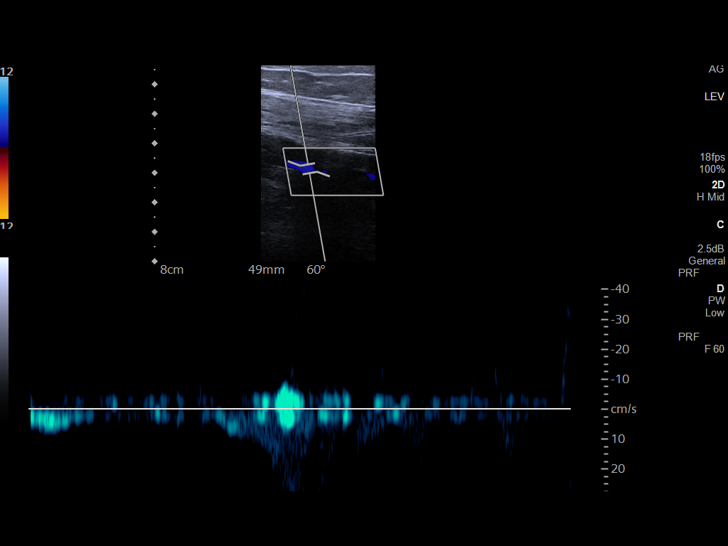
[im 25/32]
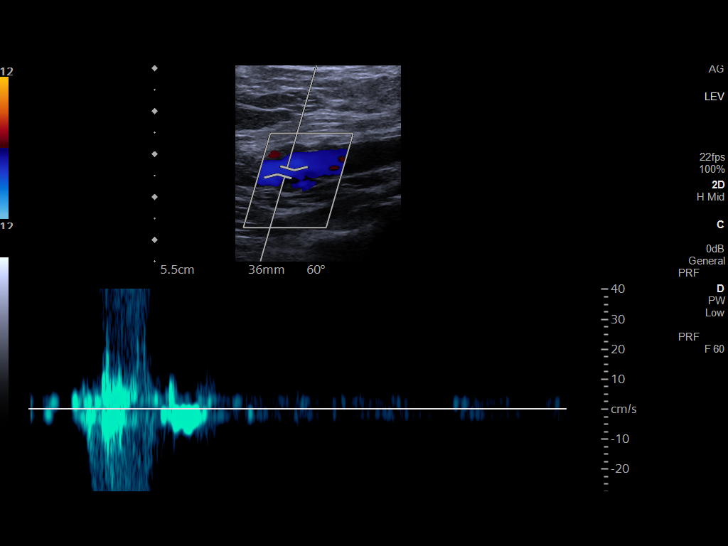
[im 26/32]
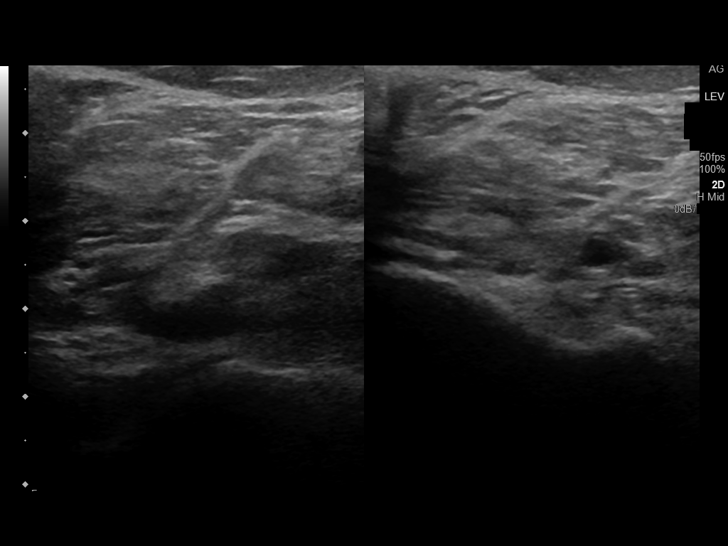
[im 29/32]
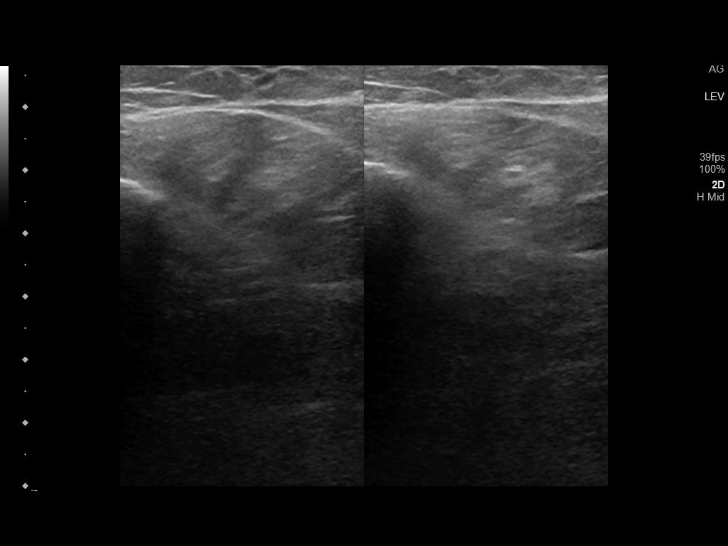
[im 32/32]
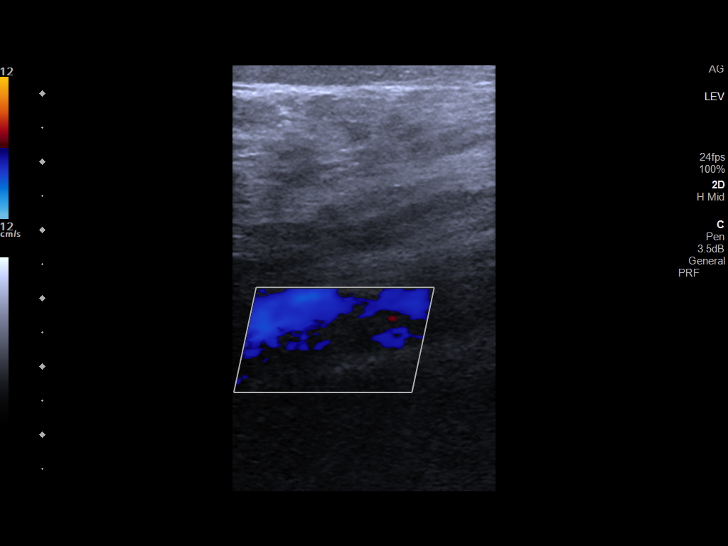

[14 of 24 positions shown; findings below may reference images not displayed]

FINDINGS: VENOUS

Normal compressibility of the common femoral, superficial femoral,
and popliteal veins, as well as the visualized calf veins.
Visualized portions of profunda femoral vein and great saphenous
vein unremarkable. No filling defects to suggest DVT on grayscale or
color Doppler imaging. Doppler waveforms show normal direction of
venous flow, normal respiratory plasticity and response to
augmentation.

Limited views of the contralateral common femoral vein are
unremarkable.

OTHER

None.

Limitations: none
IMPRESSION: Negative.

## 2022-12-10 ENCOUNTER — Other Ambulatory Visit: Payer: Self-pay | Admitting: Internal Medicine

## 2022-12-10 DIAGNOSIS — Z1231 Encounter for screening mammogram for malignant neoplasm of breast: Secondary | ICD-10-CM

## 2023-01-03 ENCOUNTER — Ambulatory Visit
Admission: RE | Admit: 2023-01-03 | Discharge: 2023-01-03 | Disposition: A | Discharge status: Home / Self Care | Payer: Medicare Other | Source: Ambulatory Visit | Attending: Internal Medicine | Admitting: Internal Medicine

## 2023-01-03 DIAGNOSIS — Z1231 Encounter for screening mammogram for malignant neoplasm of breast: Secondary | ICD-10-CM | POA: Insufficient documentation

## 2023-12-15 ENCOUNTER — Other Ambulatory Visit: Payer: Self-pay | Admitting: Internal Medicine

## 2023-12-15 DIAGNOSIS — Z1231 Encounter for screening mammogram for malignant neoplasm of breast: Secondary | ICD-10-CM

## 2024-01-02 ENCOUNTER — Ambulatory Visit (INDEPENDENT_AMBULATORY_CARE_PROVIDER_SITE_OTHER)

## 2024-01-02 ENCOUNTER — Other Ambulatory Visit (INDEPENDENT_AMBULATORY_CARE_PROVIDER_SITE_OTHER): Payer: Self-pay | Admitting: Podiatry

## 2024-01-02 DIAGNOSIS — I739 Peripheral vascular disease, unspecified: Secondary | ICD-10-CM | POA: Diagnosis not present

## 2024-01-03 ENCOUNTER — Other Ambulatory Visit: Payer: Self-pay | Admitting: Podiatry

## 2024-01-03 DIAGNOSIS — M7989 Other specified soft tissue disorders: Secondary | ICD-10-CM

## 2024-01-04 ENCOUNTER — Encounter: Payer: Self-pay | Admitting: Podiatry

## 2024-01-04 LAB — VAS US ABI WITH/WO TBI
Left ABI: 0.97
Right ABI: 1.01

## 2024-01-10 ENCOUNTER — Encounter

## 2024-01-14 ENCOUNTER — Ambulatory Visit
Admission: RE | Admit: 2024-01-14 | Discharge: 2024-01-14 | Disposition: A | Discharge status: Home / Self Care | Source: Ambulatory Visit | Attending: Podiatry | Admitting: Podiatry

## 2024-01-14 DIAGNOSIS — M7989 Other specified soft tissue disorders: Secondary | ICD-10-CM

## 2024-02-07 ENCOUNTER — Encounter
